# Patient Record
Sex: Male | Born: 1985 | Race: White | Hispanic: No | Marital: Single | Smoking: Never smoker
Health system: Southern US, Community
[De-identification: ages and names within clinical notes are randomized; demographics above are authoritative.]

## PROBLEM LIST (undated history)

## (undated) DIAGNOSIS — K5090 Crohn's disease, unspecified, without complications: Secondary | ICD-10-CM

## (undated) DIAGNOSIS — K604 Rectal fistula, unspecified: Secondary | ICD-10-CM

## (undated) DIAGNOSIS — K6289 Other specified diseases of anus and rectum: Secondary | ICD-10-CM

## (undated) DIAGNOSIS — K611 Rectal abscess: Secondary | ICD-10-CM

## (undated) HISTORY — PX: TREATMENT FISTULA ANAL: SUR1390

## (undated) HISTORY — DX: Crohn's disease, unspecified, without complications: K50.90

## (undated) HISTORY — DX: Rectal abscess: K61.1

## (undated) HISTORY — PX: OTHER SURGICAL HISTORY: SHX169

## (undated) HISTORY — DX: Other specified diseases of anus and rectum: K62.89

## (undated) HISTORY — DX: Rectal fistula: K60.4

## (undated) HISTORY — DX: Rectal fistula, unspecified: K60.40

---

## 2007-10-26 ENCOUNTER — Ambulatory Visit: Payer: Self-pay | Admitting: Internal Medicine

## 2007-10-26 ENCOUNTER — Encounter (INDEPENDENT_AMBULATORY_CARE_PROVIDER_SITE_OTHER): Payer: Self-pay | Admitting: *Deleted

## 2007-10-26 ENCOUNTER — Observation Stay (HOSPITAL_COMMUNITY): Admission: EM | Admit: 2007-10-26 | Discharge: 2007-10-27 | Payer: Self-pay | Admitting: Emergency Medicine

## 2007-11-21 DIAGNOSIS — K625 Hemorrhage of anus and rectum: Secondary | ICD-10-CM

## 2007-12-03 ENCOUNTER — Emergency Department (HOSPITAL_COMMUNITY): Admission: EM | Admit: 2007-12-03 | Discharge: 2007-12-03 | Payer: Self-pay | Admitting: Emergency Medicine

## 2008-03-10 ENCOUNTER — Emergency Department (HOSPITAL_COMMUNITY): Admission: EM | Admit: 2008-03-10 | Discharge: 2008-03-10 | Payer: Self-pay | Admitting: Emergency Medicine

## 2008-03-10 ENCOUNTER — Encounter (INDEPENDENT_AMBULATORY_CARE_PROVIDER_SITE_OTHER): Payer: Self-pay | Admitting: *Deleted

## 2008-04-14 ENCOUNTER — Emergency Department (HOSPITAL_COMMUNITY): Admission: EM | Admit: 2008-04-14 | Discharge: 2008-04-14 | Payer: Self-pay | Admitting: Emergency Medicine

## 2008-10-23 ENCOUNTER — Emergency Department (HOSPITAL_COMMUNITY): Admission: EM | Admit: 2008-10-23 | Discharge: 2008-10-23 | Payer: Self-pay | Admitting: Emergency Medicine

## 2008-11-20 ENCOUNTER — Encounter: Payer: Self-pay | Admitting: Internal Medicine

## 2008-12-02 ENCOUNTER — Telehealth: Payer: Self-pay | Admitting: Internal Medicine

## 2009-01-27 ENCOUNTER — Telehealth: Payer: Self-pay | Admitting: Internal Medicine

## 2009-01-30 ENCOUNTER — Ambulatory Visit: Payer: Self-pay | Admitting: Internal Medicine

## 2009-01-30 ENCOUNTER — Inpatient Hospital Stay (HOSPITAL_COMMUNITY): Admission: EM | Admit: 2009-01-30 | Discharge: 2009-02-04 | Payer: Self-pay | Admitting: Internal Medicine

## 2009-01-30 DIAGNOSIS — K5080 Crohn's disease of both small and large intestine without complications: Secondary | ICD-10-CM

## 2009-01-31 ENCOUNTER — Encounter (INDEPENDENT_AMBULATORY_CARE_PROVIDER_SITE_OTHER): Payer: Self-pay | Admitting: *Deleted

## 2009-01-31 ENCOUNTER — Encounter: Payer: Self-pay | Admitting: Internal Medicine

## 2009-02-19 ENCOUNTER — Ambulatory Visit: Payer: Self-pay | Admitting: Internal Medicine

## 2009-02-24 ENCOUNTER — Encounter: Payer: Self-pay | Admitting: Nurse Practitioner

## 2009-02-24 LAB — CONVERTED CEMR LAB
ALT: 25 units/L (ref 0–53)
AST: 26 units/L (ref 0–37)
Albumin: 3.8 g/dL (ref 3.5–5.2)
BUN: 10 mg/dL (ref 6–23)
Basophils Absolute: 0.1 10*3/uL (ref 0.0–0.1)
Basophils Relative: 1.1 % (ref 0.0–3.0)
Calcium: 9.2 mg/dL (ref 8.4–10.5)
Chloride: 102 meq/L (ref 96–112)
Eosinophils Absolute: 0.8 10*3/uL — ABNORMAL HIGH (ref 0.0–0.7)
Lymphocytes Relative: 24.1 % (ref 12.0–46.0)
MCHC: 33.3 g/dL (ref 30.0–36.0)
Neutrophils Relative %: 55.1 % (ref 43.0–77.0)
Potassium: 4.1 meq/L (ref 3.5–5.1)
RBC: 4.14 M/uL — ABNORMAL LOW (ref 4.22–5.81)

## 2009-03-02 ENCOUNTER — Ambulatory Visit: Payer: Self-pay | Admitting: Nurse Practitioner

## 2009-03-02 LAB — CONVERTED CEMR LAB
Basophils Absolute: 0.1 10*3/uL (ref 0.0–0.1)
Eosinophils Absolute: 0.6 10*3/uL (ref 0.0–0.7)
Lymphocytes Relative: 19.2 % (ref 12.0–46.0)
MCHC: 34 g/dL (ref 30.0–36.0)
Neutrophils Relative %: 65.4 % (ref 43.0–77.0)
Platelets: 310 10*3/uL (ref 150.0–400.0)
RDW: 13.8 % (ref 11.5–14.6)

## 2009-03-30 ENCOUNTER — Ambulatory Visit: Payer: Self-pay | Admitting: Internal Medicine

## 2009-07-15 ENCOUNTER — Encounter: Payer: Self-pay | Admitting: Internal Medicine

## 2010-03-16 NOTE — Assessment & Plan Note (Signed)
Summary: hosp f.u.Marland KitchenMarland KitchenMarland Kitchenem   History of Present Illness Visit Type: follow up  Primary GI MD: Lina Sar MD Primary Provider: n/a Requesting Provider: n/a Chief Complaint: Hosp f/u for Perirectal Abscess.  History of Present Illness:   [name] was hospitalized 01/30/09 to 02/04/09 with a  recurrent peri-rectal abscess secondary to Crohn's. 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 He was subsequently started on Azathioprine. Overall he is feeling much better. Having 1-3 soft BMs a day. Has some bleeding but unsure if coming from fistula or rectum. No fevers.  His rectal pain is better on Hydrocodone. Seen by Dr. Carolynne Edouard last week. Plan is for referral to colorectal surgeon.    GI Review of Systems      Denies abdominal pain, acid reflux, belching, bloating, chest pain, dysphagia with liquids, dysphagia with solids, heartburn, loss of appetite, nausea, vomiting, vomiting blood, weight loss, and  weight gain.      Reports rectal pain.     Denies anal fissure, black tarry stools, change in bowel habit, constipation, diarrhea, diverticulosis, fecal incontinence, heme positive stool, hemorrhoids, irritable bowel syndrome, jaundice, light color stool, liver problems, and  rectal bleeding.    Current Medications (verified): 1)  Azathioprine Sodium 100 Mg Solr (Azathioprine Sodium) .... One Tablet By Mouth Two Times A Day 2)  Hydrocodone-Acetaminophen 5-325 Mg Tabs (Hydrocodone-Acetaminophen) .... One Tablet By Mouth Every Four Hours As Needed For Pain  Allergies (verified): 1)  ! Pcn  Past History:  Past Medical History: Reviewed history from 01/30/2009 and no changes required. Hx of pelvic abcess Crohns Disease 2009  Past Surgical History: dental surgery I &D peri-rectal abscess  Family History: Family History of Breast Cancer: grandmother No FH of Colon Cancer:  Social History: Reviewed  history from 01/30/2009 and no changes required. Occupation: Waiter  [employer]  Alcohol Use - yes-2-3 beers once or twice per week Patient has never smoked.  Daily Caffeine Use Illicit Drug Use - no  Review of Systems  The patient denies allergy/sinus, anemia, anxiety-new, arthritis/joint pain, back pain, blood in urine, breast changes/lumps, change in vision, confusion, cough, coughing up blood, depression-new, fainting, fatigue, fever, headaches-new, hearing problems, heart murmur, heart rhythm changes, itching, muscle pains/cramps, night sweats, nosebleeds, shortness of breath, skin rash, sleeping problems, sore throat, swelling of feet/legs, swollen lymph glands, thirst - excessive, urination - excessive, urination changes/pain, urine leakage, vision changes, and voice change.    Vital Signs:  Patient profile:   25 year old male Height:      76 inches Weight:      165 pounds BMI:     20.16 BSA:     2.04 Pulse rate:   80 / minute Pulse rhythm:   regular BP sitting:   98 / 68  (left arm) Cuff size:   regular  Vitals Entered By: Ok Anis CMA (February 19, 2009 8:47 AM)  Physical Exam  General:  Well developed, well nourished, no acute distress. Neck:  no obvious masses  Lungs:  Clear throughout to auscultation. Heart:  Regular rate and rhythm; no murmurs, rubs,  or bruits. Abdomen:  Abdomen soft, nontender, nondistended. No obvious masses or hepatomegaly.Normal bowel sounds.  Rectal:  Setons intact. Yellow, blood-tinged drainage from healing right and left perianal wounds Msk:  Symmetrical with no gross deformities. Normal posture. Extremities:  No edema. Neurologic:  Alert and  oriented x4;  grossly normal neurologically. Skin:  Intact  without significant lesions or rashes. Cervical Nodes:  No significant cervical adenopathy. Psych:  Alert and cooperative. Normal mood and affect.   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 Until recently patient  had not been on any treatment for his disease secondary to finances. On 01/30/09 he was admitted to the hospital where he underwent I&D / placement of two Setons. He subsequently started Azathioprine and seems to be feeing better. Had follow up with surgery last week, plan is for referral to colorectal surgeon. Will contact [facility] for results of TPMT enzyme. For now continue current dose of Azathiopurine. Check labs now and CBC every two weeks until next follow up.    Orders: TLB-CBC Platelet - w/Differential (85025-CBCD) TLB-CMP (Comprehensive Metabolic Pnl) (80053-COMP)  Patient Instructions: 1)  Go to the lab, basement level. 2)  Follow up with Dr. Juanda Chance in 4 -6 weeks.   3)  We made you an appointment with Dr. Juanda Chance for 03-30-09 at 10:15AM.

## 2010-03-16 NOTE — Letter (Signed)
Summary: [facility]   Imported By: Lester Navesink 08/21/2009 10:40:41  _____________________________________________________________________  External Attachment:    Type:   Image     Comment:   External Document

## 2010-03-16 NOTE — Miscellaneous (Signed)
Summary: TPMT [facility]  Clinical Lists Changes  Orders: Added new Test order of Thiopurine Metabolites ([facility] #3200) [phone]) - Signed  Appended Document: TPMT [facility] Pt informed of the cost of this [facility] test.  Pt said he would come to our lab this week.

## 2010-05-17 LAB — URINALYSIS, ROUTINE W REFLEX MICROSCOPIC
Ketones, ur: NEGATIVE mg/dL
Nitrite: NEGATIVE
Protein, ur: NEGATIVE mg/dL
Urobilinogen, UA: 0.2 mg/dL (ref 0.0–1.0)
pH: 6 (ref 5.0–8.0)

## 2010-05-17 LAB — COMPREHENSIVE METABOLIC PANEL
AST: 20 U/L (ref 0–37)
BUN: 12 mg/dL (ref 6–23)
CO2: 27 mEq/L (ref 19–32)
Calcium: 8.8 mg/dL (ref 8.4–10.5)
Creatinine, Ser: 0.93 mg/dL (ref 0.4–1.5)
GFR calc non Af Amer: >60 mL/min (ref >60)

## 2010-05-17 LAB — CULTURE, ROUTINE-ABSCESS

## 2010-05-17 LAB — CBC
HCT: 36.3 % — ABNORMAL LOW (ref 39.0–52.0)
MCV: 87.7 fL (ref 78.0–100.0)
Platelets: 300 10*3/uL (ref 150–400)
RDW: 14.6 % (ref 11.5–15.5)

## 2010-05-17 LAB — SEDIMENTATION RATE: Sed Rate: 15 mm/hr (ref 0–16)

## 2010-05-17 LAB — C-REACTIVE PROTEIN: CRP: 1.4 mg/dL — ABNORMAL HIGH (ref <0.6)

## 2010-05-31 LAB — URINALYSIS, ROUTINE W REFLEX MICROSCOPIC
Nitrite: NEGATIVE
Specific Gravity, Urine: 1.012 (ref 1.005–1.030)
Urobilinogen, UA: 0.2 mg/dL (ref 0.0–1.0)
pH: 5.5 (ref 5.0–8.0)

## 2010-05-31 LAB — TYPE AND SCREEN: Antibody Screen: NEGATIVE

## 2010-05-31 LAB — CBC
Hemoglobin: 14.7 g/dL (ref 13.0–17.0)
Platelets: 381 10*3/uL (ref 150–400)
RDW: 14.2 % (ref 11.5–15.5)

## 2010-05-31 LAB — DIFFERENTIAL
Basophils Absolute: 0.1 10*3/uL (ref 0.0–0.1)
Basophils Relative: 1 % (ref 0–1)
Lymphocytes Relative: 12 % (ref 12–46)
Neutro Abs: 9.5 10*3/uL — ABNORMAL HIGH (ref 1.7–7.7)
Neutrophils Relative %: 76 % (ref 43–77)

## 2010-06-29 NOTE — Discharge Summary (Signed)
NAMEADOLPH, [name]                 ACCOUNT NO.:  [id number]   MEDICAL RECORD NO.:  [id number]          PATIENT TYPE:  INP   LOCATION:  5158                         FACILITY:  [facility]   PHYSICIAN:  Hedwig Morton. Juanda Chance, MD     DATE OF BIRTH:  [birth year]   DATE OF ADMISSION:  10/25/2007  DATE OF DISCHARGE:  10/27/2007                               DISCHARGE SUMMARY   ADMITTING DIAGNOSES:  1. Abdominal pain with of nausea and vomiting and long history of      intermittent rectal bleeding.  CT scan was suggestive of distal      small bowel inflammatory bowel disease and also suggestive of      intestinal abscess.  2. History of mononucleosis in his late teenage years.  3. History of placental surgery.  4. Status post multiple tattoos, starting at age 25.   DISCHARGE DIAGNOSES:  1. Crohn disease of the terminal ileum.  2. Fluid collection in what appears to be small bowel, filled with      contrast, not an abscess.  Radiology did not feel drainage      placement was indicated.  3. Elevated blood glucose, serum measurements ranging 127-168.  Does      not carry a prior diagnosis of the diabetes.  Rule out diabetes      mellitus type 2 versus glucose intolerance.   CONSULTATIONS:  None.   BRIEF HISTORY:  Mr. [name] is a 25 year old white male.  For several  months, he has had intermittent rectal bleeding and intermittent  abdominal pain that can last hours to a few days.  Generally, no nausea  or vomiting with this. but the day prior to this presentation, the  patient developed intense abdominal pain along with nausea and vomiting.  In the past, it was suggested that he see a gastroenterologist, but this  never occurred because he traveled to [place].  The patient also  describes several weeks of fatigue and weight fluctuation within a 10-15  pounds range and currently he was at the low end of that range.  He had  loose stools for several months.   The patient came to the emergency room at  [facility] for evaluation  because of the acute severity of his GI symptoms.  A CT scan performed  on October 26, 2007, showed marked inflammatory changes at the  terminal ileum suggestive of Crohn disease.  There was also a prominence  of soft tissue within the ileocolic region, possibly a conglomerate of  lymph nodes.  A pelvic abscess was also called by CT and a gas locule in  the lumen of the distal ileum near the abscess was noted possibly  representing a focal perforation.   Dr. Lina Sar was covering the unassigned GI call for that day and  admitted the patient.  The ER doctor had briefly run the patient  scenario by Dr. Alvira Philips who suggested that the ER call the GI service for  care of this patient.   LABORATORY DATA:  Initial white blood cell count 16.68, it corrected to  10.2.  Hemoglobin 14.1, hematocrit 41.2.  MCV 84.1.  Platelets 372.  Differential on the blood work showed a definite left shift.  The  erythrocyte sedimentation rate was 14 that is in the normal range.  Sodium 135, potassium 4.1.  Chloride 97, CO2 30.  Serum glucose ranged  127-168.  BUN 7, creatinine 1.1.  Total bilirubin 1.2, alkaline  phosphatase 53, AST 12, ALT 21.  Albumin 3.4.  Lipase 12.  Urinalysis  was negative.   IMAGING STUDIES:  Initial CT scan with contrast:  1. Marked inflammatory changes involving terminal ileum suggestive of      inflammatory bowel disease.  There is associated lymph adenopathy      and prominent soft tissue mass within the ileocolic mesentery      favoring conglomeration lymph nodes.  2. Pelvic abscess.  3. A locule of gas, slightly centric to the lumen of distal ileum near      the level of the abscess for which focal perforation is not      excluded.   Limited CT scan used during this study to assess for possible drain  placement showed the fluid collection in the pelvis is suspected to  represent redundant sigmoid colon and this area is filled with oral   contrast.  No definite abscess present.  The abnormal bowel with marked  wall thickening is we demonstrated extending from the pelvis to the  right lower quadrant.  No drain was placed.   HOSPITAL COURSE:  The patient was admitted to [facility] to non-  telemetry bed.  He was started on IV ciprofloxacin and Flagyl along with  available analgesics of p.o. Vicodin and IV Dilaudid as well as  antiemetics IV Phenergan p.r.n.  The patient was started on oral  Pentasa.  Stool studies for C. diff, stool culture, and O and P were  ordered, but staff was unable to collect specimens.   The patient went down to Interventional Radiology for abscess drain.  However, on repeat CT study, they determined that what looked like  abscess/fluid collection was actually redundant contrast-filled small  bowel and no drainage was indicated.  The patient was clinically  improving.  He had not had any further nausea, vomiting, fever, or  chills.  His white blood cell count had normalized.  On October 27, 2007, he was discharged home.  He had an appointment set up to see Dr.  Juanda Chance on November 27, 2007, at 8:45 a.m.  He also was given the phone  number for [facility] to contact for setting up appointment to  establish a primary care [facility] [place] and access to low-cost medications and  lower cost medical care.  The patient unfortunately does not have  medical insurance.   DISCHARGE MEDICATIONS:  1. Prednisone 40 mg, that is four 10-mg tablets, once daily for 2      weeks, then 30 mg daily.  2. Flagyl/metronidazole 250 mg 1 p.o. t.i.d. for 10 days with a      warning not to use alcohol while taking this medication.  3. Vicodin 5/500 mg 1 tablet every 6 hours as needed for severe pain.  4. He did not get a prescription for Pentasa.   DISCHARGE DIET:  Low fiber.   DISCHARGE CONDITION:  Stable and improved.      Jennye Moccasin, PA-C      Hedwig Morton. Juanda Chance, MD  Electronically Signed    SG/MEDQ  D:   02/27/2008  T:  02/27/2008  Job:  [id number]  cc:   Hedwig Morton. Juanda Chance, MD  Bonnye Fava

## 2010-06-29 NOTE — H&P (Signed)
[name], [name]                 ACCOUNT NO.:  [id number]   MEDICAL RECORD NO.:  [id number]          Wells TYPE:  INP   LOCATION:  5158                         FACILITY:  [facility]   PHYSICIAN:  Hedwig Morton. Juanda Chance, MD     DATE OF BIRTH:  [birth year]   DATE OF ADMISSION:  10/26/2007  DATE OF DISCHARGE:                              HISTORY & PHYSICAL   CHIEF COMPLAINT:  Abdominal pain, nausea, and vomiting.   HISTORY OF PRESENT ILLNESS:  [name] is a 25 year old white male.  He  has at least a year's history of intermittent rectal bleeding and then  several months history of intermittent pain in Kent stomach that lasts  several hours up to a few days.  It has not historically been associated  with nausea or vomiting, but all that changed yesterday when Kent pain  was intense and he started throwing up.  He says that several months  back when he was living in [place] a primary care physician wanted him  to be seen at a gastroenterologist's office, but [name] went to  [place] and never did see a GI specialist.  He has now returned to  [place], is working here, although he is without medical health  insurance.  For Kent last 2 months, he has noticed a decline in energy.  Weight has fluctuated within a 10-15 pounds range and he says he is at  Kent low end of Kent range right now.  Stools have been loose for several  months.  [name] presented to Kent emergency room because of Kent  intensity of symptoms with Kent additional new problem of nausea and  vomiting and underwent initial labs and CT imaging per Kent ER staff.   [name] was given 2 mg of Dilaudid, Zofran, and IV fluids in Kent  emergency room and his symptoms were much improved, although now, a few  hours later he is having some recurrent abdominal pain and requesting  analgesia.   [name] has not been using any medication at home to relieve his  symptoms.  No antidiarrheals no analgesics.  Food traditionally has not  necessarily impacted Kent level of pain, but sometimes certain foods seem  to have triggered more pain when he is having a particularly bad flare.  Kent amount of blood that he sees is sometimes Kent blood in Kent commode  water surrounding Kent stool, sometimes he sees it when he wipes and  rarely has he seen it just pure blood and no stool.   Labs were remarkable for a white blood cell count of 16.6.  CT scan is  remarkable for what looks like Crohn disease in Kent terminal ileum with  abscesses and possibly focal perforation in Kent region of Kent terminal  ileum.  Temperature on arrival was 101.1 and his pulse was 135.  Dr.  Baruch Merl was contacted, but did not see [name].  She suggested  calling Dr. Juanda Chance, who is covering for GI unassigned.  We are now  evaluating [name] and plan to admit him to [facility] hospital for  further  studies and symptomatic care.   PAST MEDICAL HISTORY:  [name] had mononucleosis in his late teen  years.  He underwent dental surgery within Kent past few years.  He does  have multiple tattoos on Kent right arm and Kent first of these started at  age 36.  He had some of Kent usual childhood illnesses and is up-to-date  on immunizations as far as he knows.   MEDICATIONS:  Not taking any medications, either prescribed or over-Kent-  counter.   ALLERGIES:  His mother has informed him that he has a penicillin allergy  but he does know what Kent reaction was.   FAMILY HISTORY:  Negative for Crohn disease, ulcerative colitis, or  cancers.  His mother lives in [place] currently, her phone number  right now is [phone].   SOCIAL HISTORY:  [name] lives in [place].  He is single.  He is  working as a Airline pilot at [employer].  He has been  studying business in marketing, right now not in school but plans to  reenter [employer] this spring semester.  Alcohol intake currently consists of  about 2-3 beers once or twice a week.  In Kent  past, he was able to drink  up to a 6-pack though not often he drinks that much and he tended to  drink beer on a daily basis.  [name] is currently without medical  health insurance.   REVIEW OF SYSTEMS:  CONSTITUTIONAL:  [name] has decreased energy.  [name] notes that he used to run 4 miles several times a week, but  in Kent last couple months he has not had Kent energy to do this.  VISUAL:  He is not having any visual problems.  ENT:  No sore throat.  No sores  in his mouth.  CARDIOVASCULAR:  No palpitations.  No chest pain.  PULMONARY:  No shortness of breath.  No cough.  GU:  No dysuria.  No  increased frequency.  MUSCULOSKELETAL:  Does have some pain in his  sacral iliac sometimes when he has been on his feet a long time, but it  is not severe pain and it certainly is not awakening him at night.  DERMATOLOGIC:  He does have tattoos on Kent upper right arm.  He started  on Kent tattoo that he has there at age 4.  He says that he has always  gone to reputable tattoo parlors who he thinks are following good  hygiene.  NEUROLOGIC:  No paresthesias, no syncope, and no dizziness.  PSYCHIATRIC:  No mood swings.  ENDOCRINE:  No excessive thirst.  No  history of problems with his blood sugars.  HEMATOLOGIC:  Other than Kent  GI bleeding, he has not had any unusual bleeding.  No swelling of lymph  nodes.  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  He has also been involved as  of a paid participant in medical research and that they also tested him  for various viruses and he has always been negative.   PHYSICAL EXAMINATION:  GENERAL:  [name] is a tall, thin, somewhat  pale young white male who is in no distress.  He provides an excellent  history.  VITAL SIGNS:  Temperature maximum of 101.1, blood pressure 125/68, pulse  initially was  135,  but a couple of hours later was rechecked and was 86.  His respirations are 16.  His room air saturation is 98%.  HEENT:  Sclerae are nonicteric.  Conjunctivae are pink.  Extraocular  movements intact.  Oropharynx is moist and clear.  Dentition in good  repair.  NECK:  No masses, no JVD, no cough, and no dyspnea.  CARDIOVASCULAR:  Regular rate and rhythm.  No murmurs, rubs, or gallops.  ABDOMEN:  Soft with hypoactive bowel sounds.  There is no masses, no  hepatosplenomegaly.  There is some tenderness without guarding or  rebound and Kent tenderness is more notable in Kent left lower quadrant  and less so in Kent right lower quadrant.  RECTAL:  Kent rectal vault is empty.  There are no internal masses.  There is a small external hemorrhoidal tag palpable.  EXTREMITIES:  There is no cyanosis, clubbing, or edema.  DERMATOLOGIC:  There is an extensive series of tattoos on Kent right  upper arm up into Kent right shoulder.  NEUROLOGIC:  [name] is alert and oriented x3.  There is no tremor.  He moves all 4 limbs easily.  He is able to walk without assistance.  PSYCHIATRIC:  [name] is pleasant, cooperative, and seems  intelligent.   IMPRESSION:  Long history of intermittent rectal bleeding and several  months history of intermittent bouts of abdominal pain, now with  particularly bad flare of pain along with nausea and vomiting.  CT scan  highly suggestive of Crohn disease involving Kent distal small bowel and  also Kent CT is revealing what looks like abscesses.  [name] is  febrile and has a leukocytosis.   PLAN:  [name] is being admitted to Dr. Regino Schultze service for  hydration.  We will allow full liquids for Kent time being.  Provide  Vicodin, Dilaudid, and Phenergan as needed.  In Kent emergency room, Kent  Wells received antibiotics of clindamycin and gentamicin.  We will  continue inpatient antibiotics of Cipro and Flagyl.  Laboratory wise, we  will plan to check a sed rate  and followup a CBC in a couple of days.      Jennye Moccasin, PA-C      Hedwig Morton. Juanda Chance, MD  Electronically Signed    SG/MEDQ  D:  10/26/2007  T:  10/26/2007  Job:  [id number]

## 2010-06-29 NOTE — H&P (Signed)
[name], [name]                 ACCOUNT NO.:  [id number]   MEDICAL RECORD NO.:  [id number]          PATIENT TYPE:  INP   LOCATION:  5158                         FACILITY:  [facility]   PHYSICIAN:  Hedwig Morton. Juanda Chance, MD     DATE OF BIRTH:  01-23-1986   DATE OF ADMISSION:  10/26/2007  DATE OF DISCHARGE:                              HISTORY & PHYSICAL   ADDENDUM   LABORATORY DATA:  White blood cell count 16.6, hemoglobin 14.8,  hematocrit 44.2, and platelets 348,000.  MCV 85.  Sodium 135 and  potassium 4.1.  Chloride 97 and CO2 of 30.  BUN 7 and creatinine 1.1.  Glucose 127.  Albumin 3.4, total bilirubin 1.2, alkaline phosphatase 12,  and lipase 21.  AST 53 and ALT 21.  CT scan of the abdomen and pelvis,  abdominal view shows no abnormalities.  In the pelvis, there are marked  inflammatory changes at the terminal ileum, suggestive of inflammatory  bowel disease, specifically Crohn disease.  Less likely consideration is  infectious ileitis.  There is associated lymphadenopathy.  Prominent  soft tissue mass within the ileocolic mesentery favors a conglomeration  of lymph nodes, but attention to followup is necessary.  There is  approximately 7 x 2 x 5.7 x 6.8 cm pelvic abscess.  Locule of gas at the  distal lumen near the abscess, which could represent focal perforation.      Jennye Moccasin, PA-C      Hedwig Morton. Juanda Chance, MD  Electronically Signed    SG/MEDQ  D:  10/26/2007  T:  10/26/2007  Job:  [id number]

## 2010-06-29 NOTE — Consult Note (Signed)
NAME:  BLASE, BECKNER NO.:  [id number]   MEDICAL RECORD NO.:  [id number]          PATIENT TYPE:  EMS   LOCATION:  MAJO                         FACILITY:  [facility]   PHYSICIAN:  Alfonse Ras, MD   DATE OF BIRTH:  1985-11-27   DATE OF CONSULTATION:  DATE OF DISCHARGE:                                 CONSULTATION   REQUESTING PHYSICIAN:  Chriss Driver, MD in the ER.   CHIEF COMPLAINT:  Left perirectal abscess.   HISTORY OF PRESENT ILLNESS:  Mr. Halleck is a 25 year old male patient with  known Crohn disease involving the terminal ileum, diagnosed in September  2009.  He was last hospitalized on February 27, 2008, with a Crohn  exacerbation.  At that time, he was treated with steroids and Flagyl and  sent out on tapering steroids, which he has subsequently completed.  He  has not followed up with Dr. Juanda Chance, his GI doctor, yet because of job  responsibilities and extensive travel.  He has noted 2 weeks of  discomfort involving the left buttock, 1 week of a noticeable boil  involving the left buttock and perirectal region, increasing pain over  the past week, especially with movement.  He has also noticed in the  past week a resumption of his bloody mucus diarrhea, similar to Crohn  exacerbation.  He has not noticed any purulence in this fluid or any  unusual odors.  He presented to the ER because of pain, and his white  count was elevated at 12,600.  He underwent a CT of the abdomen and  pelvis, which showed actual decrease in inflammatory change of the  terminal ileum as compared to previous scan, but he does have a left  perirectal abscess that extends anteriorly and inferiorly, reaching the  base of the penis with inflammation of the medial aspect of the upper  left thigh.  This area measures 6.2 x 1.6 x 4.7 cm.  Dr. Colin Benton has  reviewed the CTs with me prior to coming to the ER and recommends  bedside I&D of the abscess.   REVIEW OF SYSTEMS:  The patient has reported  no fevers or chills at  home.  At home, he has had pain with voiding today and was moving around  today, he felt a cold chill.  He has a chronic mild abdominal pain  related to his Crohn disease although his symptoms have greatly improved  since last September and have not worsened since the last  hospitalization.   SOCIAL HISTORY:  No tobacco.  Social alcohol only.  He travels  extensively with his job.  He works as a Production manager of gold, silver, and  antiques.   FAMILY AND MEDICAL HISTORY:  Noncontributory.   PAST MEDICAL HISTORY:  1. Crohn's of the terminal ileum, diagnosed in September in 2009.  2. Hemorrhoids.   PAST SURGICAL HISTORY:  Denies.   ALLERGIES:  PENICILLIN.   CURRENT MEDICATIONS:  None.   PHYSICAL EXAMINATION:  GENERAL:  A pleasant male patient complaining of  the left buttock pain and anxiety over impending I&AND procedure.  VITAL SIGNS:  Temperature 97.7, BP 105/60, pulse 62 and regular,  respirations 18.  PSYCH:  The patient is alert and oriented x3.  His affect is appropriate  to current situation.  NEURO:  Cranial nerves II-XII grossly intact.  He is moving all  extremities x4 without focal deficits.  CHEST:  Bilateral lung sounds clear to auscultation.  Respiratory effort  is nonlabored.  No JVD.  No peripheral edema.  Pulses nontachycardic.  ABDOMEN:  Soft and mildly tender without guarding or rebounding,  nondistended.  Bowel sounds are present.  GENITOURINARY:  External genitalia are normal in appearance.  Scrotum  has both testes down and palpable.  He does have some erythema extending  towards the base of the penis without any fluctuance or induration felt  there.  SKIN:  There is a visible indurated area involving left buttock,  extending towards the rectal area.  The rectum itself is tender  externally.  DRE was not performed.  This area is nonfluctuant but is  indurated and quite tender.  This area measures externally about 5-6 cm  at its length  anterior to posterior with a lateral width of about 2-2.5  cm.  There is no purulence noted.  EXTREMITIES:  Symmetrical in appearance without cyanosis or clubbing.   LABORATORY DATA:  Urinalysis is negative.  White count is 12,600,  hemoglobin 14.7.  CT of the abdomen and pelvis is noted with a fluid  collection involving the perirectal, extending down towards the thigh,  and nearly up towards the base of the penis.   IMPRESSION:  Perirectal abscess.   PLAN:  1. Per Dr. Tami Lin recommendation, bedside I&D.  2. Outpatient antibiotics to cover MRSA, as well as to cover any      potential Chron's and enteric pathogens as well.  If cultures were      able to be obtained, we will follow up on those, otherwise, plan on      using doxycycline and Flagyl.  3. Follow up with [facility] in 3 days.      Allison L. Rolene Course      Alfonse Ras, MD  Electronically Signed    ALE/MEDQ  D:  03/10/2008  T:  03/10/2008  Job:  [id number]   cc:   Hedwig Morton. Juanda Chance, MD

## 2010-06-29 NOTE — Op Note (Signed)
NAME:  [name], BAUMLER NO.:  [id number]   MEDICAL RECORD NO.:  [id number]          PATIENT TYPE:  EMS   LOCATION:  MAJO                         FACILITY:  [facility]   PHYSICIAN:  Alfonse Ras, MD   DATE OF BIRTH:  13-Oct-1985   DATE OF PROCEDURE:  DATE OF DISCHARGE:                               OPERATIVE REPORT   PROCEDURE:  Incision and drainage of perirectal abscess.   ANESTHESIA:  Lidocaine 1% infiltrated directly into the skin above into  and surrounding the left perirectal abscess.  In addition, the nurse  gave the patient 1 mg of Dilaudid IV.   PROCEDURE:  Once superficial and deep anesthesia has been achieved with  infiltration of lidocaine using a #11 scalpel, a 1.5-cm elliptical  incision was made with immediate returns of fresh red blood.  No  purulence was noted.  This incision was made directly over the area of  induration adjacent to the rectum and the buttock region.  This extends  out about 1.5 to 2 cm away from the rectum.  This area was explored with  hemostat deeply without any purulence return.  Additional anesthetic  agent was infiltrated more distally below the initial incision  superficially and deeply towards the base of the penis to allow deeper  exploration of the wound.  Again the forceps were introduced maybe a  small amount of purulence was noted but I was unable to reach a definite  pocket of purulence as noted on CT. The wound again had mainly blood  returns.  The wound was packed with quarter inch Iodoform packing and a  dry dressing applied to achieve hemostasis.      Allison L. Rolene Course      Alfonse Ras, MD  Electronically Signed    ALE/MEDQ  D:  03/10/2008  T:  03/11/2008  Job:  [phone] [phone]

## 2010-11-16 LAB — COMPREHENSIVE METABOLIC PANEL
Albumin: 3.8
Alkaline Phosphatase: 54
BUN: 9
CO2: 28
Chloride: 100
Creatinine, Ser: 1.1
GFR calc non Af Amer: >60
Potassium: 4.4
Total Bilirubin: 1.5 — ABNORMAL HIGH

## 2010-11-16 LAB — URINALYSIS, ROUTINE W REFLEX MICROSCOPIC
Hgb urine dipstick: NEGATIVE
Nitrite: NEGATIVE
Protein, ur: NEGATIVE
Specific Gravity, Urine: 1.028
Urobilinogen, UA: 0.2

## 2010-11-16 LAB — CBC
HCT: 42.3
Hemoglobin: 14.1
MCV: 85.1
Platelets: 398
RBC: 4.98
WBC: 13.3 — ABNORMAL HIGH

## 2010-11-16 LAB — DIFFERENTIAL
Basophils Absolute: 0.1
Basophils Relative: 1
Eosinophils Relative: 1
Lymphocytes Relative: 10 — ABNORMAL LOW
Monocytes Absolute: 0.8
Neutro Abs: 11 — ABNORMAL HIGH

## 2010-11-16 LAB — LIPASE, BLOOD: Lipase: 18

## 2010-11-17 LAB — URINALYSIS, ROUTINE W REFLEX MICROSCOPIC
Glucose, UA: NEGATIVE
Hgb urine dipstick: NEGATIVE
Ketones, ur: NEGATIVE
Protein, ur: NEGATIVE
Urobilinogen, UA: 0.2

## 2010-11-17 LAB — COMPREHENSIVE METABOLIC PANEL
Albumin: 3.4 — ABNORMAL LOW
BUN: 7
Calcium: 9
Creatinine, Ser: 1.16
GFR calc Af Amer: >60
Total Bilirubin: 1.2
Total Protein: 7.1

## 2010-11-17 LAB — CBC
HCT: 44.2
Hemoglobin: 14.1
MCHC: 33.5
MCHC: 34.1
MCV: 85.4
Platelets: 348
RBC: 4.9
RDW: 14.4

## 2010-11-17 LAB — DIFFERENTIAL
Basophils Absolute: 0
Lymphocytes Relative: 5 — ABNORMAL LOW
Lymphs Abs: 0.8
Monocytes Absolute: 0.7
Monocytes Relative: 4
Neutro Abs: 15.1 — ABNORMAL HIGH

## 2010-11-17 LAB — BASIC METABOLIC PANEL
CO2: 28
Calcium: 9.1
GFR calc Af Amer: >60
GFR calc non Af Amer: >60
Potassium: 5.3 — ABNORMAL HIGH
Sodium: 135

## 2012-02-29 ENCOUNTER — Encounter (INDEPENDENT_AMBULATORY_CARE_PROVIDER_SITE_OTHER): Payer: Self-pay | Admitting: General Surgery

## 2012-02-29 ENCOUNTER — Ambulatory Visit (INDEPENDENT_AMBULATORY_CARE_PROVIDER_SITE_OTHER): Payer: 59 | Admitting: General Surgery

## 2012-02-29 VITALS — BP 130/90 | HR 78 | Temp 97.8°F | Resp 18 | Ht 76.0 in | Wt 188.0 lb

## 2012-02-29 DIAGNOSIS — K612 Anorectal abscess: Secondary | ICD-10-CM

## 2012-02-29 DIAGNOSIS — K611 Rectal abscess: Secondary | ICD-10-CM | POA: Insufficient documentation

## 2012-02-29 DIAGNOSIS — K603 Anal fistula: Secondary | ICD-10-CM

## 2012-02-29 MED ORDER — CIPROFLOXACIN HCL 500 MG PO TABS: 500.0000 mg | ORAL_TABLET | Freq: Two times a day (BID) | ORAL | Status: AC

## 2012-02-29 MED ORDER — METRONIDAZOLE 500 MG PO TABS: 500.0000 mg | ORAL_TABLET | Freq: Three times a day (TID) | ORAL | Status: DC

## 2012-02-29 NOTE — Patient Instructions (Signed)
We drained a small abscess in your perianal area on the left side today.  I suspect that you have another fistula in ano.  Take a warm tub bath 3 times a day. Wear a pad. Expected drainage.  I have called in a prescription for Cipro and Flagyl to the [facility] on [address]. This is a 15 day supply.  You may return to see Dr. Derrell Lolling or Dr. Carolynne Edouard in 1 month.  Ewer strongly advised to return to see Dr. Charlena Cross, your colorectal surgeon in [place].

## 2012-02-29 NOTE — Progress Notes (Signed)
Patient ID: [name], male   DOB: January 12, 1986, 27 y.o.   MRN: [id number] History: This patient was referred to the urgent office clinic by Dr. Kateri Plummer at [facility] for a perirectal abscess. The patient states he had a perirectal abscess drained a few years ago. He states that he later had to have a fistula operation with placement of setons. 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 Charlena Cross, colorectal surgery in [facility].  Marland Kitchen The patient says states he saw Dr. Toni Arthurs once. Significant history is he was given a diagnosis of Crohn's disease by CT scan in 2009. He has never followed up with a gastroenterologist. He states that Dr. Kateri Plummer is actively referring him to a gastroenterlogist at this time.  Current problem is a one-week history of rectal pain and a little but of bloody drainage. He is not having diarrhea or fever or chills  Exam: A thin young man in no distress. Afebrile Abdomen soft flat nontender. No scars. No masses. No hernias Rectal he has a little bit of induration on the left side slightly left posterior. I found the external draining sinus and was able to pass a probe into this. After anesthetizing this with Xylocaine I opened it up to be sure there wasn't an abscess and there was very minimal pus.. Anoscopy was performed and I suspect that he may have an internal opening of the left side as well. There was no intramural abscess.  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 Called to [facility] [place].  Sitz baths 3 times daily Return to see [place] in one month I strongly urged him to see Dr. Charlena Cross in [place] in followup for further management of this complex fistula in  [facility], as previously advised. I strongly urged him to establish with a gastroenterologist. He states he will do all of this.   Angelia Mould. Derrell Lolling, M.D., [facility] General and Minimally invasive Surgery Breast and Colorectal Surgery Office:   [phone] Pager:   [phone]

## 2012-03-06 ENCOUNTER — Ambulatory Visit (INDEPENDENT_AMBULATORY_CARE_PROVIDER_SITE_OTHER): Payer: 59 | Admitting: Surgery

## 2012-03-09 ENCOUNTER — Other Ambulatory Visit: Payer: Self-pay | Admitting: Gastroenterology

## 2012-03-09 DIAGNOSIS — K611 Rectal abscess: Secondary | ICD-10-CM

## 2012-03-09 DIAGNOSIS — R109 Unspecified abdominal pain: Secondary | ICD-10-CM

## 2012-03-13 ENCOUNTER — Ambulatory Visit
Admission: RE | Admit: 2012-03-13 | Discharge: 2012-03-13 | Disposition: A | Discharge status: Home / Self Care | Payer: 59 | Source: Ambulatory Visit | Attending: Gastroenterology | Admitting: Gastroenterology

## 2012-03-13 DIAGNOSIS — R109 Unspecified abdominal pain: Secondary | ICD-10-CM

## 2012-03-13 DIAGNOSIS — K611 Rectal abscess: Secondary | ICD-10-CM

## 2012-03-13 MED ORDER — IOHEXOL 300 MG/ML  SOLN
100.0000 mL | Freq: Once | INTRAMUSCULAR | Status: AC | PRN
  Administered 2012-03-13: 100 mL via INTRAVENOUS

## 2012-04-02 ENCOUNTER — Encounter (INDEPENDENT_AMBULATORY_CARE_PROVIDER_SITE_OTHER): Payer: 59 | Admitting: General Surgery

## 2012-04-26 ENCOUNTER — Encounter (INDEPENDENT_AMBULATORY_CARE_PROVIDER_SITE_OTHER): Payer: 59 | Admitting: General Surgery

## 2014-02-13 ENCOUNTER — Inpatient Hospital Stay: Payer: No Typology Code available for payment source | Admitting: Anesthesiology

## 2014-02-13 ENCOUNTER — Emergency Department: Payer: No Typology Code available for payment source

## 2014-02-13 ENCOUNTER — Inpatient Hospital Stay
Admission: EM | Admit: 2014-02-13 | Discharge: 2014-02-15 | DRG: 355 | Disposition: A | Discharge status: Home / Self Care | Payer: No Typology Code available for payment source | Attending: Surgery | Admitting: Surgery

## 2014-02-13 ENCOUNTER — Inpatient Hospital Stay: Payer: No Typology Code available for payment source | Admitting: Surgery

## 2014-02-13 ENCOUNTER — Encounter
Admission: EM | Disposition: A | Discharge status: Home / Self Care | Payer: Self-pay | Source: Home / Self Care | Attending: Surgery

## 2014-02-13 DIAGNOSIS — K352 Acute appendicitis with generalized peritonitis: Secondary | ICD-10-CM

## 2014-02-13 DIAGNOSIS — K3532 Acute appendicitis with perforation and localized peritonitis, without abscess: Secondary | ICD-10-CM

## 2014-02-13 DIAGNOSIS — K429 Umbilical hernia without obstruction or gangrene: Secondary | ICD-10-CM | POA: Diagnosis present

## 2014-02-13 DIAGNOSIS — K5000 Crohn's disease of small intestine without complications: Principal | ICD-10-CM | POA: Diagnosis present

## 2014-02-13 HISTORY — PX: LAPAROSCOPY, DIAGNOSTIC: SHX4584

## 2014-02-13 HISTORY — PX: REPAIR, UMBILICAL HERNIA: SHX5330

## 2014-02-13 LAB — COMPREHENSIVE METABOLIC PANEL
ALT: 22 U/L (ref 0–55)
AST (SGOT): 16 U/L (ref 5–34)
Albumin/Globulin Ratio: 1.3 (ref 0.9–2.2)
Albumin: 4.2 g/dL (ref 3.5–5.0)
Alkaline Phosphatase: 54 U/L (ref 38–106)
Anion Gap: 8 (ref 5.0–15.0)
BUN: 12 mg/dL (ref 9.0–28.0)
Bilirubin, Total: 1.3 mg/dL — ABNORMAL HIGH (ref 0.2–1.2)
CO2: 28 mEq/L (ref 22–29)
Calcium: 9.5 mg/dL (ref 8.5–10.5)
Chloride: 102 mEq/L (ref 100–111)
Creatinine: 0.9 mg/dL (ref 0.7–1.3)
Globulin: 3.2 g/dL (ref 2.0–3.6)
Glucose: 107 mg/dL — ABNORMAL HIGH (ref 70–100)
Potassium: 3.9 mEq/L (ref 3.5–5.1)
Protein, Total: 7.4 g/dL (ref 6.0–8.3)
Sodium: 138 mEq/L (ref 136–145)

## 2014-02-13 LAB — CBC AND DIFFERENTIAL
Basophils Absolute Automated: 0.02 10*3/uL (ref 0.00–0.20)
Basophils Automated: 0 %
Eosinophils Absolute Automated: 0.13 10*3/uL (ref 0.00–0.70)
Eosinophils Automated: 1 %
Hematocrit: 41.1 % — ABNORMAL LOW (ref 42.0–52.0)
Hgb: 14.9 g/dL (ref 13.0–17.0)
Immature Granulocytes Absolute: 0.02 10*3/uL
Immature Granulocytes: 0 %
Lymphocytes Absolute Automated: 1.27 10*3/uL (ref 0.50–4.40)
Lymphocytes Automated: 10 %
MCH: 30.8 pg (ref 28.0–32.0)
MCHC: 36.3 g/dL — ABNORMAL HIGH (ref 32.0–36.0)
MCV: 84.9 fL (ref 80.0–100.0)
MPV: 9.8 fL (ref 9.4–12.3)
Monocytes Absolute Automated: 0.95 10*3/uL (ref 0.00–1.20)
Monocytes: 7 %
Neutrophils Absolute: 10.94 10*3/uL — ABNORMAL HIGH (ref 1.80–8.10)
Neutrophils: 82 %
Nucleated RBC: 0 /100 WBC (ref 0–1)
Platelets: 290 10*3/uL (ref 140–400)
RBC: 4.84 10*6/uL (ref 4.70–6.00)
RDW: 14 % (ref 12–15)
WBC: 13.31 10*3/uL — ABNORMAL HIGH (ref 3.50–10.80)

## 2014-02-13 LAB — URINALYSIS, REFLEX TO MICROSCOPIC EXAM IF INDICATED
Bilirubin, UA: NEGATIVE
Blood, UA: NEGATIVE
Glucose, UA: NEGATIVE
Ketones UA: NEGATIVE
Leukocyte Esterase, UA: NEGATIVE
Nitrite, UA: NEGATIVE
Protein, UR: 30 — AB
Specific Gravity UA: 1.035 (ref 1.001–1.035)
Urine pH: 5 (ref 5.0–8.0)
Urobilinogen, UA: NEGATIVE mg/dL

## 2014-02-13 LAB — GFR: EGFR: >60

## 2014-02-13 LAB — LIPASE: Lipase: 23 U/L (ref 8–78)

## 2014-02-13 LAB — HEMOLYSIS INDEX: Hemolysis Index: 11 (ref 0–18)

## 2014-02-13 SURGERY — LAPAROSCOPY, DIAGNOSTIC
Anesthesia: Anesthesia General | Site: Abdomen | Wound class: Clean

## 2014-02-13 MED ORDER — DEXAMETHASONE SOD PHOSPHATE PF 10 MG/ML IJ SOLN
INTRAMUSCULAR | Status: AC
Start: 2014-02-13 — End: ?
  Filled 2014-02-13: qty 1

## 2014-02-13 MED ORDER — HYDROMORPHONE HCL PF 1 MG/ML IJ SOLN
INTRAMUSCULAR | Status: DC | PRN
Start: 2014-02-13 — End: 2014-02-13
  Administered 2014-02-13: 0.5 mg via INTRAVENOUS
  Administered 2014-02-13: .5 mg via INTRAVENOUS

## 2014-02-13 MED ORDER — ONDANSETRON HCL 4 MG/2ML IJ SOLN
INTRAMUSCULAR | Status: AC
Start: 2014-02-13 — End: ?
  Filled 2014-02-13: qty 2

## 2014-02-13 MED ORDER — FENTANYL CITRATE 0.05 MG/ML IJ SOLN
INTRAMUSCULAR | Status: AC
Start: 2014-02-13 — End: ?
  Filled 2014-02-13: qty 2

## 2014-02-13 MED ORDER — ROCURONIUM BROMIDE 50 MG/5ML IV SOLN
INTRAVENOUS | Status: AC
Start: 2014-02-13 — End: ?
  Filled 2014-02-13: qty 5

## 2014-02-13 MED ORDER — DEXTROSE-NACL 5-0.45 % IV SOLN
100.00 mL/h | INTRAVENOUS | Status: AC
Start: 2014-02-14 — End: 2014-02-15
  Administered 2014-02-14: 100 mL/h via INTRAVENOUS

## 2014-02-13 MED ORDER — FENTANYL CITRATE 0.05 MG/ML IJ SOLN
INTRAMUSCULAR | Status: AC
Start: 2014-02-13 — End: 2014-02-13
  Administered 2014-02-13: 25 ug via INTRAVENOUS
  Filled 2014-02-13: qty 2

## 2014-02-13 MED ORDER — IOHEXOL 350 MG/ML IV SOLN
100.00 mL | Freq: Once | INTRAVENOUS | Status: AC | PRN
Start: 2014-02-13 — End: 2014-02-13
  Administered 2014-02-13: 100 mL via INTRAVENOUS

## 2014-02-13 MED ORDER — NALOXONE HCL 0.4 MG/ML IJ SOLN
0.2000 mg | INTRAMUSCULAR | Status: DC | PRN
Start: 2014-02-13 — End: 2014-02-15

## 2014-02-13 MED ORDER — GLYCOPYRROLATE 0.2 MG/ML IJ SOLN
INTRAMUSCULAR | Status: DC | PRN
Start: 2014-02-13 — End: 2014-02-13
  Administered 2014-02-13: .6 mg via INTRAVENOUS

## 2014-02-13 MED ORDER — GLYCOPYRROLATE 0.2 MG/ML IJ SOLN
INTRAMUSCULAR | Status: AC
Start: 2014-02-13 — End: ?
  Filled 2014-02-13: qty 3

## 2014-02-13 MED ORDER — ONDANSETRON 4 MG PO TBDP
4.0000 mg | ORAL_TABLET | Freq: Three times a day (TID) | ORAL | Status: DC | PRN
Start: 2014-02-13 — End: 2014-02-15

## 2014-02-13 MED ORDER — MORPHINE SULFATE 2 MG/ML IJ/IV SOLN (WRAP)
4.0000 mg | Status: DC | PRN
Start: 2014-02-13 — End: 2014-02-13

## 2014-02-13 MED ORDER — LIDOCAINE HCL 2 % IJ SOLN
INTRAMUSCULAR | Status: DC | PRN
Start: 2014-02-13 — End: 2014-02-15
  Administered 2014-02-13: 60 mg

## 2014-02-13 MED ORDER — SODIUM CHLORIDE 0.9 % IV BOLUS
1000.00 mL | Freq: Once | INTRAVENOUS | Status: AC
Start: 2014-02-13 — End: 2014-02-13
  Administered 2014-02-13: 1000 mL via INTRAVENOUS

## 2014-02-13 MED ORDER — PROPOFOL 10 MG/ML IV EMUL
INTRAVENOUS | Status: AC
Start: 2014-02-13 — End: ?
  Filled 2014-02-13: qty 20

## 2014-02-13 MED ORDER — ONDANSETRON HCL 4 MG/2ML IJ SOLN
4.00 mg | Freq: Three times a day (TID) | INTRAMUSCULAR | Status: DC | PRN
Start: 2014-02-13 — End: 2014-02-13

## 2014-02-13 MED ORDER — FENTANYL CITRATE 0.05 MG/ML IJ SOLN
25.0000 ug | INTRAMUSCULAR | Status: AC | PRN
Start: 2014-02-13 — End: 2014-02-13
  Administered 2014-02-13 (×3): 25 ug via INTRAVENOUS

## 2014-02-13 MED ORDER — SODIUM CHLORIDE 0.9 % IV MBP
4.5000 g | Freq: Three times a day (TID) | INTRAVENOUS | Status: DC
Start: 2014-02-14 — End: 2014-02-15
  Administered 2014-02-14 – 2014-02-15 (×5): 4.5 g via INTRAVENOUS
  Filled 2014-02-13 (×2): qty 100
  Filled 2014-02-13: qty 20
  Filled 2014-02-13 (×2): qty 100
  Filled 2014-02-13 (×3): qty 20
  Filled 2014-02-13: qty 100
  Filled 2014-02-13: qty 20

## 2014-02-13 MED ORDER — ONDANSETRON HCL 4 MG/2ML IJ SOLN
4.0000 mg | Freq: Three times a day (TID) | INTRAMUSCULAR | Status: DC | PRN
Start: 2014-02-13 — End: 2014-02-15

## 2014-02-13 MED ORDER — ENOXAPARIN SODIUM 40 MG/0.4ML SC SOLN
40.0000 mg | Freq: Every day | SUBCUTANEOUS | Status: DC
Start: 2014-02-14 — End: 2014-02-15
  Administered 2014-02-14 – 2014-02-15 (×2): 40 mg via SUBCUTANEOUS
  Filled 2014-02-13 (×2): qty 0.4

## 2014-02-13 MED ORDER — DEXAMETHASONE SODIUM PHOSPHATE 4 MG/ML IJ SOLN (WRAP)
INTRAMUSCULAR | Status: DC | PRN
Start: 2014-02-13 — End: 2014-02-13
  Administered 2014-02-13: 8 mg via INTRAVENOUS

## 2014-02-13 MED ORDER — PROPOFOL INFUSION 10 MG/ML
INTRAVENOUS | Status: DC | PRN
Start: 2014-02-13 — End: 2014-02-13
  Administered 2014-02-13: 200 mg via INTRAVENOUS

## 2014-02-13 MED ORDER — BUPIVACAINE-EPINEPHRINE (PF) 0.25% -1:200000 IJ SOLN
INTRAMUSCULAR | Status: AC
Start: 2014-02-13 — End: ?
  Filled 2014-02-13: qty 30

## 2014-02-13 MED ORDER — HYDROMORPHONE HCL 1 MG/ML IJ SOLN
0.5000 mg | INTRAMUSCULAR | Status: DC | PRN
Start: 2014-02-13 — End: 2014-02-13

## 2014-02-13 MED ORDER — PROMETHAZINE HCL 25 MG/ML IJ SOLN
6.2500 mg | Freq: Once | INTRAMUSCULAR | Status: DC | PRN
Start: 2014-02-13 — End: 2014-02-13

## 2014-02-13 MED ORDER — HYDROMORPHONE HCL 1 MG/ML IJ SOLN
INTRAMUSCULAR | Status: AC
Start: 2014-02-13 — End: ?
  Filled 2014-02-13: qty 1

## 2014-02-13 MED ORDER — MIDAZOLAM HCL 2 MG/2ML IJ SOLN
INTRAMUSCULAR | Status: DC | PRN
Start: 2014-02-13 — End: 2014-02-13
  Administered 2014-02-13: 2 mg via INTRAVENOUS

## 2014-02-13 MED ORDER — LACTATED RINGERS IV SOLN
INTRAVENOUS | Status: DC | PRN
Start: 2014-02-13 — End: 2014-02-13

## 2014-02-13 MED ORDER — NEOSTIGMINE METHYLSULFATE 1 MG/ML IJ SOLN
INTRAMUSCULAR | Status: DC | PRN
Start: 2014-02-13 — End: 2014-02-13
  Administered 2014-02-13: 4 mg via INTRAVENOUS

## 2014-02-13 MED ORDER — HYDROMORPHONE HCL 2 MG PO TABS
2.0000 mg | ORAL_TABLET | ORAL | Status: DC | PRN
Start: 2014-02-13 — End: 2014-02-15
  Administered 2014-02-14 (×5): 2 mg via ORAL
  Filled 2014-02-13 (×5): qty 1

## 2014-02-13 MED ORDER — OXYCODONE-ACETAMINOPHEN 5-325 MG PO TABS
1.0000 | ORAL_TABLET | Freq: Once | ORAL | Status: DC | PRN
Start: 2014-02-13 — End: 2014-02-13

## 2014-02-13 MED ORDER — ONDANSETRON HCL 4 MG/2ML IJ SOLN
INTRAMUSCULAR | Status: DC | PRN
Start: 2014-02-13 — End: 2014-02-13
  Administered 2014-02-13: 4 mg via INTRAVENOUS

## 2014-02-13 MED ORDER — KETOROLAC TROMETHAMINE 15 MG/ML IJ SOLN
15.0000 mg | Freq: Three times a day (TID) | INTRAMUSCULAR | Status: DC | PRN
Start: 2014-02-13 — End: 2014-02-15
  Administered 2014-02-14 – 2014-02-15 (×2): 15 mg via INTRAVENOUS
  Filled 2014-02-13 (×2): qty 1

## 2014-02-13 MED ORDER — ACETAMINOPHEN 325 MG PO TABS
650.0000 mg | ORAL_TABLET | Freq: Four times a day (QID) | ORAL | Status: DC
Start: 2014-02-14 — End: 2014-02-15
  Administered 2014-02-14 – 2014-02-15 (×6): 650 mg via ORAL
  Filled 2014-02-13 (×6): qty 2

## 2014-02-13 MED ORDER — MIDAZOLAM HCL 2 MG/2ML IJ SOLN
INTRAMUSCULAR | Status: AC
Start: 2014-02-13 — End: ?
  Filled 2014-02-13: qty 2

## 2014-02-13 MED ORDER — BUPIVACAINE-EPINEPHRINE (PF) 0.25% -1:200000 IJ SOLN
INTRAMUSCULAR | Status: DC | PRN
Start: 2014-02-13 — End: 2014-02-13
  Administered 2014-02-13: 15 mL via INTRAMUSCULAR

## 2014-02-13 MED ORDER — FENTANYL CITRATE 0.05 MG/ML IJ SOLN
INTRAMUSCULAR | Status: DC | PRN
Start: 2014-02-13 — End: 2014-02-13
  Administered 2014-02-13: 100 ug via INTRAVENOUS

## 2014-02-13 MED ORDER — SODIUM CHLORIDE 0.9 % IV SOLN
INTRAVENOUS | Status: DC
Start: 2014-02-14 — End: 2014-02-15

## 2014-02-13 MED ORDER — ONDANSETRON HCL 4 MG/2ML IJ SOLN
4.0000 mg | Freq: Once | INTRAMUSCULAR | Status: DC | PRN
Start: 2014-02-13 — End: 2014-02-13

## 2014-02-13 MED ORDER — ROCURONIUM BROMIDE 50 MG/5ML IV SOLN
INTRAVENOUS | Status: DC | PRN
Start: 2014-02-13 — End: 2014-02-13
  Administered 2014-02-13: 35 mg via INTRAVENOUS

## 2014-02-13 MED ORDER — GLYCOPYRROLATE 0.2 MG/ML IJ SOLN
INTRAMUSCULAR | Status: AC
Start: 2014-02-13 — End: ?
  Filled 2014-02-13: qty 1

## 2014-02-13 MED ORDER — NEOSTIGMINE METHYLSULFATE 1 MG/ML IJ SOLN
INTRAMUSCULAR | Status: AC
Start: 2014-02-13 — End: ?
  Filled 2014-02-13: qty 10

## 2014-02-13 MED ORDER — MORPHINE SULFATE 10 MG/ML IJ/IV SOLN (WRAP)
6.0000 mg | Freq: Once | Status: AC
Start: 2014-02-13 — End: 2014-02-13
  Administered 2014-02-13: 6 mg via INTRAVENOUS
  Filled 2014-02-13: qty 1

## 2014-02-13 MED ORDER — STERILE WATER FOR INJECTION IJ SOLN
INTRAMUSCULAR | Status: AC
Start: 2014-02-13 — End: ?
  Filled 2014-02-13: qty 10

## 2014-02-13 MED ORDER — SODIUM CHLORIDE 0.9 % IV MBP
4.50 g | Freq: Once | INTRAVENOUS | Status: AC
Start: 2014-02-13 — End: 2014-02-13
  Administered 2014-02-13: 4.5 g via INTRAVENOUS
  Filled 2014-02-13: qty 20
  Filled 2014-02-13: qty 100

## 2014-02-13 MED ORDER — HYDROMORPHONE HCL 1 MG/ML IJ SOLN
INTRAMUSCULAR | Status: AC
Start: 2014-02-13 — End: 2014-02-14
  Filled 2014-02-13: qty 1

## 2014-02-13 MED ORDER — IOHEXOL 300 MG/ML IJ SOLN
INTRAMUSCULAR | Status: AC
Start: 2014-02-13 — End: 2014-02-14
  Filled 2014-02-13: qty 50

## 2014-02-13 SURGICAL SUPPLY — 39 items
ADHESIVE SKIN SURGISEAL .35ML (Suture) ×2 IMPLANT
APPLIER IN CLP TI MED LG LIGAMAX 5MM (Endoscopic Supplies) ×1
APPLIER INTERNAL CLIP MEDIUM LARGE (Endoscopic Supplies) ×1
APPLIER INTERNAL CLIP MEDIUM LARGE TITANIUM ANGLE JAW DISTAL TIP CLOSE (Endoscopic Supplies) ×1 IMPLANT
CABLE HIGH FREQUENCY MONOPOLAR (Cautery) IMPLANT
CANULA STABILITY 5MM (Procedure Accessories) ×2 IMPLANT
CLIP ENDO 10 MM (Laparoscopy Supplies) IMPLANT
GLOVE SURG BIOGEL PF LTX SZ7.0 (Glove) ×2 IMPLANT
GLOVE SURG BIOGEL SZ6.5 (Glove) ×2 IMPLANT
INACTIVE USE LAWSON 120900 (Gown) ×2 IMPLANT
PACK GEN LAPAROSCOPY FFX (Pack) ×2 IMPLANT
PAD ELECTROSRG GRND REM W CRD (Procedure Accessories) ×2 IMPLANT
POUCH SPEC RTRVL PU E-CTCH GLD 10MM 34.5 (Laparoscopy Supplies) ×1
POUCH SPECIMEN RETRIEVAL L34.5 CM (Laparoscopy Supplies) ×1
POUCH SPECIMEN RETRIEVAL L34.5 CM ERGONOMIC HANDLE LONG CYLINDRICAL (Laparoscopy Supplies) ×1 IMPLANT
RELOAD STAPLER 2 MM 2.5 MM 3 MM L60 MM (Staplers) ×1
RELOAD STAPLER 2 MM 2.5 MM 3 MM L60 MM ENDO GIA TITANIUM MEDIUM (Staplers) ×1 IMPLANT
RELOAD STPLR TI 2MM 2.5MM 3MM EGIA 60MM (Staplers) ×1
SHEAR SCALP HARM ACE (Cautery) ×2 IMPLANT
SOLUTION IRR 0.9% NACL 500ML LF STRL PLS (Irrigation Solutions) ×1
SOLUTION IRRIGATION 0.9% SDM CHLORIDE 500ML PR BTTL ISOTONIC NONPRGNC (Irrigation Solutions) ×1 IMPLANT
SOLUTION IRRIGATION 0.9% SODIUM CHLORIDE (Irrigation Solutions) ×1
SOLUTION IV 0.9% NACL 1000ML VFLX LF PLS (IV Solutions) ×1
SOLUTION IV 0.9% SODIUM CHLORIDE PVC (IV Solutions) ×1
SOLUTION IV 0.9% SODIUM CHLORIDE PVC 1000 ML PH 5 PLASTIC CONTAINER (IV Solutions) ×1 IMPLANT
SPONGE CHLRPRP TINT 26ML (Applicator) ×2 IMPLANT
STAPLER INTNL PVC STD UNV EGIA 12MM (Staplers) ×1
STAPLER TISSUE L16 CM X W4 MM OD12 MM (Staplers) ×1
STAPLER TISSUE L16 CM X W4 MM OD12 MM ENDOSCOPIC ENDO GIA PVC INTERNAL (Staplers) ×1 IMPLANT
SUCTION IRRIGATOR 2 WITH TIP (Suction) IMPLANT
SUTURE MONOCRYL 4-0 PS2 27IN (Suture) ×2 IMPLANT
SUTURE VICRYL 0 UR6 27IN (Suture) ×4 IMPLANT
TRAY FOLEY CATHETER 16F (Catheter Micellaneous)
TRAY FOLEY CATHETER 16F (Catheter Miscellaneous) IMPLANT
TROCAR BLADELESS ENDO 5X100MM (Laparoscopy Supplies) ×2 IMPLANT
TROCAR ENDOPATH XCEL BLUNT TIP (Laparoscopy Supplies) ×2 IMPLANT
TROCAR XCEL ENDO TIP 12MM (Laparoscopy Supplies) ×2 IMPLANT
TUBING INSUFFLATION LUER CONNECTOR FILTER HIGH FLOW CLEANFLOW SU1101 (Respiratory Supplies) ×1 IMPLANT
TUBING INSUFFLATION W/CO2 GUAR (Respiratory Supplies) ×2

## 2014-02-13 NOTE — ED Provider Notes (Signed)
EMERGENCY DEPARTMENT HISTORY AND PHYSICAL EXAM     Physician/Midlevel provider first contact with patient: 02/13/14 1758         Date: 02/13/2014  Patient Name: Richard Coleman  Attending Physician:  Ames Dura, DO, FACOEP      History of Presenting Illness     Chief Complaint   Patient presents with   . Abdominal Pain     History Provided By: Pt  Chief Complaint: Abd pain  Onset: 4 am  Timing: constant  Location: right sided  Quality: dull, got sharp after 8 hours  Severity: moderate  Modifying Factors: no relief with Tylenol  Associated sxs: nausea    Additional History: Richard Coleman is a 28 y.o. male presented to the ED with a constant right sided abd pain x 4 am that the pt described as dull. Pt sts that after 8 hours his pain became sharp and more focused on the RLQ. Pt was seen at urgent care and told to come to the ED. Pt also reports nausea. Pt's last PO intake was pasta last night. Wife also had the pasta and has no symptoms. Pt has never had similar symptoms. Pt has never had abd surgery.     Pt denies vomiting, diarrhea, and dysuria.     PCP: Pcp, Noneorunknown, MD      Current Facility-Administered Medications   Medication Dose Route Frequency Provider Last Rate Last Dose   . iohexol (OMNIPAQUE) 300 MG/ML injection              No current outpatient prescriptions on file.       Past Medical History   Past Medical History:  History reviewed. No pertinent past medical history.    Past Surgical History:  History reviewed. No pertinent past surgical history.    Family History:  History reviewed. No pertinent family history.    Social History:  History   Substance Use Topics   . Smoking status: Never Smoker    . Smokeless tobacco: Not on file   . Alcohol Use: Yes      Comment: socially       Allergies:  No Known Allergies    Review of Systems     Review of Systems   Gastrointestinal: Positive for nausea and abdominal pain. Negative for vomiting and diarrhea.   Genitourinary: Negative for dysuria.   All other  systems reviewed and are negative.         Physical Exam     BP 130/81 mmHg  Pulse 92  Temp(Src) 97.9 F (36.6 C) (Oral)  Resp 16  SpO2 99%  Pulse Oximetry Analysis - Normal 97% on RA    Physical Exam   Constitutional: He is oriented to person, place, and time. He appears well-developed and well-nourished. No distress.   HENT:   Head: Normocephalic and atraumatic.   Nose: Nose normal.   Eyes: Conjunctivae are normal.   Neck: Normal range of motion. Neck supple.   Cardiovascular: Normal rate and regular rhythm.    Pulmonary/Chest: Effort normal and breath sounds normal. No stridor.   Abdominal: Soft. He exhibits no distension. There is tenderness (RLQ tenderness). There is guarding. There is no rebound.   Musculoskeletal: Normal range of motion. He exhibits no edema.   Neurological: He is alert and oriented to person, place, and time. He exhibits normal muscle tone.   Skin: Skin is warm and dry. He is not diaphoretic.   Psychiatric: He has a normal mood and affect. His behavior  is normal.   Nursing note and vitals reviewed.          Diagnostic Study Results     Labs -     Results     Procedure Component Value Units Date/Time    UA, Reflex to Microscopic [[id number]]  (Abnormal) Collected:  02/13/14 1709    Specimen Information:  Urine Updated:  02/13/14 1757     Urine Type Clean Catch      Color, UA Amber (A)      Clarity, UA Clear      Specific Gravity UA 1.035      Urine pH 5.0      Leukocyte Esterase, UA Negative      Nitrite, UA Negative      Protein, UR 30 (A)      Glucose, UA Negative      Ketones UA Negative      Urobilinogen, UA Negative mg/dL      Bilirubin, UA Negative      Blood, UA Negative      RBC, UA 0 - 5 /hpf      WBC, UA 0 - 5 /hpf      Urine Mucus Present     Comprehensive Metabolic Panel (CMP) [[id number]]  (Abnormal) Collected:  02/13/14 1647    Specimen Information:  Blood Updated:  02/13/14 1718     Glucose 107 (H) mg/dL      BUN 62.1 mg/dL      Creatinine 0.9 mg/dL      Sodium 308 mEq/L       Potassium 3.9 mEq/L      Chloride 102 mEq/L      CO2 28 mEq/L      CALCIUM 9.5 mg/dL      Protein, Total 7.4 g/dL      Albumin 4.2 g/dL      AST (SGOT) 16 U/L      ALT 22 U/L      Alkaline Phosphatase 54 U/L      Bilirubin, Total 1.3 (H) mg/dL      Globulin 3.2 g/dL      Albumin/Globulin Ratio 1.3      Anion Gap 8.0     Lipase [[id number]] Collected:  02/13/14 1647    Specimen Information:  Blood Updated:  02/13/14 1718     Lipase 23 U/L     Hemolysis index [[id number]] Collected:  02/13/14 1647     Hemolysis Index 11 Updated:  02/13/14 1718    GFR [[id number]] Collected:  02/13/14 1647     EGFR >60.0 Updated:  02/13/14 1718    CBC with differential [[id number]]  (Abnormal) Collected:  02/13/14 1647    Specimen Information:  Blood / Blood Updated:  02/13/14 1656     WBC 13.31 (H) x10 3/uL      Hgb 14.9 g/dL      Hematocrit 25.9 (L) %      Platelets 290 x10 3/uL      RBC 4.84 x10 6/uL      MCV 84.9 fL      MCH 30.8 pg      MCHC 36.3 (H) g/dL      RDW 14 %      MPV 9.8 fL      Neutrophils 82 %      Lymphocytes Automated 10 %      Monocytes 7 %      Eosinophils Automated 1 %      Basophils Automated 0 %  Immature Granulocyte 0 %      Nucleated RBC 0 /100 WBC      Neutrophils Absolute 10.94 (H) x10 3/uL      Abs Lymph Automated 1.27 x10 3/uL      Abs Mono Automated 0.95 x10 3/uL      Abs Eos Automated 0.13 x10 3/uL      Absolute Baso Automated 0.02 x10 3/uL      Absolute Immature Granulocyte 0.02 x10 3/uL           Radiologic Studies -   Radiology Results (24 Hour)     Procedure Component Value Units Date/Time    CT Abd/Pelvis with IV Contrast only [[id number]] Collected:  02/13/14 1855    Order Status:  Completed Updated:  02/13/14 1905    Narrative:      HISTORY: Abdominal pain    COMPARISON: None available.    TECHNIQUE:  Continuous axial images were obtained from the top of the  diaphragms through the pubic symphysis without oral contrast during the  uneventful intravenous injection of non-ionic contrast.  Multiplanar  coronal and sagittal images were created at the CT scanner.      FINDINGS:   Lung bases: The lung bases are clear. There are no pleural effusions.  Liver: No mass.  Biliary tree:No ductal dilatation.  Gallbladder: Present  Spleen: Normal  Pancreas: No mass or duct dilatation.  Adrenals: Normal  Kidneys: Normal  Aorta: Not dilated  Retroperitoneum: No significant adenopathy  GI Tract: Increased density adjacent to the cecum and in the region of  the distal ileum. Abnormal thickening of the appendix and increased  density adjacent to the appendix. Irregular pockets of fluid and air in  the mesentery. No discrete drainable abscess. Findings are consistent  with perforated appendicitis. Small amount of ascites in the pelvis.  Appendix: See above  Peritoneum and mesentery:See above  Bladder: Normal  Pelvis: The prostate and seminal vesicles are normal.  Bones: No significant bony pathology  Abdominal wall: No mass or hernial.      Impression:       Appendicitis with periappendiceal inflammation and small  pockets of fluid and gas.          Lynnae Prude, MD   02/13/2014 7:01 PM        .    Medical Decision Making   I am the first provider for this patient.    I reviewed the vital signs, available nursing notes, past medical history, past surgical history, family history and social history.    Vital Signs-Reviewed the patient's vital signs.     Patient Vitals for the past 12 hrs:   BP Temp Pulse Resp   02/13/14 1918 130/81 mmHg - 92 16   02/13/14 1634 139/77 mmHg 97.9 F (36.6 C) 99 16         Old Medical Records: Nursing notes.         Doctor's Notes     ED Course:   6:15 PM - Pt declined pain or nausea medications.     7:20 PM - Updated pt on results and diagnosis of appendicitis. Still with RLQ tenderness with guarding.    7:38 PM - Discussed pt case with Dr. Ned Grace, general surgeon, who is aware of results and he will take to pt to the OR tonight.      Diagnosis and Treatment Plan       Clinical  Impression:   1. Acute perforated appendicitis  Treatment Plan:   ED Disposition     Admit Admitting Physician: Delena Serve [[id number]]  Diagnosis: Acute perforated appendicitis [578469]  Estimated Length of Stay: > or = to 2 midnights  Tentative Discharge Plan?: Home or Self Care [1]  Patient Class: Inpatient [101]              _______________________________    Attestations:  This note is prepared by Lazarus Salines, acting as scribe for Ames Dura, DO, FACOEP. The scribe's documentation has been prepared under my direction and personally reviewed by me in its entirety.  I confirm that the note above accurately reflects all work, treatment, procedures, and medical decision making performed by me.     I am the first provider for this patient.      Ames Dura, DO, FACOEP is the primary emergency doctor of record.    _______________________________          Ames Dura, DO  02/13/14 2036

## 2014-02-13 NOTE — ED Notes (Signed)
RLQ abd pain, which woke him from sleep at 0400. Nausea, no vomiting or fever.  Seen at Urgent Care and referred to ED for evaluation of possible appendicitis.

## 2014-02-13 NOTE — H&P (Signed)
HISTORY AND PHYSICAL  [facility]      Admission Date: 02/13/2014  Patient Name: Richard Coleman  Attending Physician: No att. providers found  Admitting Service:  General Surgery    Reason for Admission: Abdominal Pain    History of Present Illness:   Richard Coleman is a 28 y.o. male who  has no past medical history on file.Marland Kitchen  He  presented to the hospital with complaints of abdominal pain.  The pain is described as cramping and sharp, and is located in the RLQ without radiation. Onset was 17 hours ago. Symptoms have been gradually worsening since. Aggravating factors: activity and eating.  Alleviating factors: none. Associated symptoms: nausea. 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  He also notes that he has had some GI symptoms with loose stools over the last couple of weeks but denies abdominal pain until today. No fever.  Wbc 13.  Ct scan with thickened inflamed appendix with some fluid adjacent and small pockets of air in mesentery concerning for perforation.    Past Medical History:   History reviewed. No pertinent past medical history.    Past Surgical History:   History reviewed. No pertinent past surgical history.    Family History:   History reviewed. No pertinent family history.    Social History:     History     Social History   . Marital Status: Married     Spouse Name: N/A     Number of Children: N/A   . Years of Education: N/A     Social History Main Topics   . Smoking status: Never Smoker    . Smokeless tobacco: Not on file   . Alcohol Use: Yes      Comment: socially   . Drug Use: No   . Sexual Activity: Not on file     Other Topics Concern   . Not on file     Social History Narrative   . No narrative on file       Allergies:   No Known Allergies    Medications:     Prior to Admission medications    Medication Sig Start Date End Date Taking?  Authorizing Provider   acetaminophen (TYLENOL) 325 MG tablet Take 650 mg by mouth.   Yes [provider]       Review of Systems:   As per the HPI.  The patient denies any additional changes to their otic, opthalmologic, dermatologic, pulmonary, cardiac, gastrointestinal, genitourinary, musculoskeletal, hematologic, constitutional, or psychiatric systems.    Physical Exam:     Filed Vitals:    02/13/14 2100   BP: 138/84   Pulse: 90   Temp: 98.1 F (36.7 C)   Resp: 20   SpO2: 99%       General appearance - alert, well appearing, and in no distress, oriented to person, place, and time and normal appearing weight  Mental status - alert, oriented to person, place, and time  Eyes - pupils equal and reactive, extraocular eye movements intact, sclera anicteric  Ears - bilateral TM's and external ear canals normal  Nose - normal and patent, no erythema, discharge or polyps  Mouth - mucous membranes moist, pharynx normal without lesions  Neck - supple, no significant adenopathy  Chest - clear to auscultation, no wheezes, rales or rhonchi, symmetric air entry  Heart - normal  rate, regular rhythm, normal S1, S2, no murmurs, rubs, clicks or gallops  Abdomen - tenderness noted RLQ without rebound or guarding, no pain on the other side, no rebound  Neurological - alert, oriented, normal speech, no focal findings or movement disorder noted  Musculoskeletal - no joint tenderness, deformity or swelling  Extremities - peripheral pulses normal, no pedal edema, no clubbing or cyanosis  Skin - normal coloration and turgor, no rashes, no suspicious skin lesions noted    Labs:     Results     Procedure Component Value Units Date/Time    UA, Reflex to Microscopic [[id number]]  (Abnormal) Collected:  02/13/14 1709    Specimen Information:  Urine Updated:  02/13/14 1757     Urine Type Clean Catch      Color, UA Amber (A)      Clarity, UA Clear      Specific Gravity UA 1.035      Urine pH 5.0      Leukocyte Esterase, UA Negative       Nitrite, UA Negative      Protein, UR 30 (A)      Glucose, UA Negative      Ketones UA Negative      Urobilinogen, UA Negative mg/dL      Bilirubin, UA Negative      Blood, UA Negative      RBC, UA 0 - 5 /hpf      WBC, UA 0 - 5 /hpf      Urine Mucus Present     Comprehensive Metabolic Panel (CMP) [[id number]]  (Abnormal) Collected:  02/13/14 1647    Specimen Information:  Blood Updated:  02/13/14 1718     Glucose 107 (H) mg/dL      BUN 78.2 mg/dL      Creatinine 0.9 mg/dL      Sodium 956 mEq/L      Potassium 3.9 mEq/L      Chloride 102 mEq/L      CO2 28 mEq/L      CALCIUM 9.5 mg/dL      Protein, Total 7.4 g/dL      Albumin 4.2 g/dL      AST (SGOT) 16 U/L      ALT 22 U/L      Alkaline Phosphatase 54 U/L      Bilirubin, Total 1.3 (H) mg/dL      Globulin 3.2 g/dL      Albumin/Globulin Ratio 1.3      Anion Gap 8.0     Lipase [[id number]] Collected:  02/13/14 1647    Specimen Information:  Blood Updated:  02/13/14 1718     Lipase 23 U/L     Hemolysis index [[id number]] Collected:  02/13/14 1647     Hemolysis Index 11 Updated:  02/13/14 1718    GFR [[id number]] Collected:  02/13/14 1647     EGFR >60.0 Updated:  02/13/14 1718    CBC with differential [[id number]]  (Abnormal) Collected:  02/13/14 1647    Specimen Information:  Blood / Blood Updated:  02/13/14 1656     WBC 13.31 (H) x10 3/uL      Hgb 14.9 g/dL      Hematocrit 03.4 (L) %      Platelets 290 x10 3/uL      RBC 4.84 x10 6/uL      MCV 84.9 fL      MCH 30.8 pg      MCHC 36.3 (H) g/dL      RDW  14 %      MPV 9.8 fL      Neutrophils 82 %      Lymphocytes Automated 10 %      Monocytes 7 %      Eosinophils Automated 1 %      Basophils Automated 0 %      Immature Granulocyte 0 %      Nucleated RBC 0 /100 WBC      Neutrophils Absolute 10.94 (H) x10 3/uL      Abs Lymph Automated 1.27 x10 3/uL      Abs Mono Automated 0.95 x10 3/uL      Abs Eos Automated 0.13 x10 3/uL      Absolute Baso Automated 0.02 x10 3/uL      Absolute Immature Granulocyte 0.02 x10 3/uL           Rads:      Radiology Results (24 Hour)     Procedure Component Value Units Date/Time    CT Abd/Pelvis with IV Contrast only [[id number]] Collected:  02/13/14 1855    Order Status:  Completed Updated:  02/13/14 1905    Narrative:      HISTORY: Abdominal pain    COMPARISON: None available.    TECHNIQUE:  Continuous axial images were obtained from the top of the  diaphragms through the pubic symphysis without oral contrast during the  uneventful intravenous injection of non-ionic contrast. Multiplanar  coronal and sagittal images were created at the CT scanner.      FINDINGS:   Lung bases: The lung bases are clear. There are no pleural effusions.  Liver: No mass.  Biliary tree:No ductal dilatation.  Gallbladder: Present  Spleen: Normal  Pancreas: No mass or duct dilatation.  Adrenals: Normal  Kidneys: Normal  Aorta: Not dilated  Retroperitoneum: No significant adenopathy  GI Tract: Increased density adjacent to the cecum and in the region of  the distal ileum. Abnormal thickening of the appendix and increased  density adjacent to the appendix. Irregular pockets of fluid and air in  the mesentery. No discrete drainable abscess. Findings are consistent  with perforated appendicitis. Small amount of ascites in the pelvis.  Appendix: See above  Peritoneum and mesentery:See above  Bladder: Normal  Pelvis: The prostate and seminal vesicles are normal.  Bones: No significant bony pathology  Abdominal wall: No mass or hernial.      Impression:       Appendicitis with periappendiceal inflammation and small  pockets of fluid and gas.          Lynnae Prude, MD   02/13/2014 7:01 PM            Impression:     Patient Active Problem List   Diagnosis   . Acute perforated appendicitis     RLQ pain with appendicitis on CT scan and likely perforation, but with his history this could be crohn's disease and perforation.    Plan:   Plan for lap appendectomy, possible open, possible ileocectomy. I explained that if this is Crohn's disease, a simple  appendectomy may not be possible.  Risks explained including the increased risk of abscess formation with perforation.  Also concern for fistula formation with h/o possible crohn's disease.  Unforseen events also consented. Patient acknowledged and consented for surgery.  Zosyn therapeutically  scds    Signed by: Celene Squibb , MD [facility]  [facility]  [address]  [place], [place] [address]  [phone]

## 2014-02-13 NOTE — Transfer of Care (Signed)
Anesthesia Transfer of Care Note    Patient: Richard Coleman    Procedures performed: Procedure(s):  LAPAROSCOPY, DIAGNOSTIC  REPAIR, UMBILICAL HERNIA    Anesthesia type: General ETT    Patient location:Phase I PACU    Last vitals:   Filed Vitals:    02/13/14 2216   BP: 130/79   Pulse: 88   Temp: 36.2 C (97.1 F)   Resp: 16   SpO2: 98%       Post pain: Patient not complaining of pain, continue current therapy      Mental Status:awake    Respiratory Function: tolerating room air    Cardiovascular: stable    Nausea/Vomiting: patient not complaining of nausea or vomiting    Hydration Status: adequate    Post assessment: no apparent anesthetic complications

## 2014-02-13 NOTE — Op Note (Signed)
OPERATIVE NOTE    Date Time: 02/13/2014 10:07 PM  Patient Name: Richard Coleman  Attending Physician: No att. providers found    Date of Operation:   02/13/2014    Providers Performing:   Surgeon(s):  Delena Serve, MD    Operative Procedure:   Procedure(s):  LAPAROSCOPY, DIAGNOSTIC  REPAIR, UMBILICAL HERNIA    Preoperative Diagnosis:   Pre-Op Diagnosis Codes:     * Appendicitis with perforation [K35.2]  H/o crohn's disease  Postoperative Diagnosis:   *crohn's ileitis  Umbilical hernia  Indications:   rlq pain with ct scan showing inflammation in mesentery with pockets of air, inflamed appendix concern for perforated appendicitis    Details of Procedure:   Patient after informed consent was taken to the operating room. Anesthesia was administered and he was prepped and draped with chlorprep and scds placed.   He was on therapeutic antibiotics. An infraumbilical incision was made. Dissection bluntly down to the fascia was performed and then the umbilicus was encircled and divided above the hernia sac.  The hernia was reduced and then we used this to insert the 12 mm hassan.  I inserted additional 5mm suprapubic and left lateral ports.  The cecum was floppy in rlq without evidence of inflammation, a very thin tiny noninflamed appendix was noted to go retrocecal and was traced to its tip.  It was normal in appearance.  The terminal ileum however was inflamed just at the ileocecal junction without walled off inflammation for approximately a 8 cm segment. Minimal fluid noted in pelvis.  I opted to avoid an appendectomy in the setting of active crohn;s disease and removed the ports.  I then  Closed the fascia of the umbilicus with 0 vicryl suture and tacked umbilicus back with 3-0 vicryl.   All skin closed with 4-0 monocryl and glue.    The skin was closed after tacking down the umbilicus to the fascia with 3-0 vicryl.  4-0 monocryl was used subcuticularly and surgical glue for the dressing. The patient was extubated  and transferred to the PACU for recover.    Estimated Blood Loss:   < 10 ml    Replaced fluids:   700 ml    Implants:   * No implants in log *    Drains:     None    Specimens:   none    Complications:   * No complications entered in OR log *      Signed by: Delena Serve, MD

## 2014-02-13 NOTE — Anesthesia Preprocedure Evaluation (Signed)
Anesthesia Evaluation    AIRWAY    Mallampati: II    TM distance: >3 FB  Neck ROM: full  Mouth Opening:full   CARDIOVASCULAR    cardiovascular exam normal       DENTAL    no notable dental hx     PULMONARY    pulmonary exam normal     OTHER FINDINGS                      Anesthesia Plan    ASA 1     general               (The most common side effects from anesthesia are nausea, vomiting, sore throat.  Uncommon side effects include but not limited to  injury to eyes, lips, teeth, gums, vocal cords, allergic rections, nerve injuries. In addition there are side effects associated with your medical conditions.    Answered all questions to patient's satisfaction.  Pt understands and wishes to proceed.    Lucylle Foulkes Robert Jaymeson Mengel, MD  08/16/2011 )      intravenous induction           Post op pain management: per surgeon    informed consent obtained    Plan discussed with CRNA.

## 2014-02-14 DIAGNOSIS — K429 Umbilical hernia without obstruction or gangrene: Secondary | ICD-10-CM | POA: Diagnosis present

## 2014-02-14 LAB — MAN DIFF ONLY
Band Neutrophils Absolute: 0 10*3/uL (ref 0.00–1.00)
Band Neutrophils: 0 %
Basophils Absolute Manual: 0 10*3/uL (ref 0.00–0.20)
Basophils Manual: 0 %
Eosinophils Absolute Manual: 0 10*3/uL (ref 0.00–0.70)
Eosinophils Manual: 0 %
Lymphocytes Absolute Manual: 0.25 10*3/uL — ABNORMAL LOW (ref 0.50–4.40)
Lymphocytes Manual: 2 %
Monocytes Absolute: 0.13 10*3/uL (ref 0.00–1.20)
Monocytes Manual: 1 %
Neutrophils Absolute Manual: 12.29 10*3/uL — ABNORMAL HIGH (ref 1.80–8.10)
Nucleated RBC: 0 /100 WBC (ref 0–1)
Segmented Neutrophils: 97 %

## 2014-02-14 LAB — CBC AND DIFFERENTIAL
Hematocrit: 38.6 % — ABNORMAL LOW (ref 42.0–52.0)
Hgb: 13.6 g/dL (ref 13.0–17.0)
MCH: 30.3 pg (ref 28.0–32.0)
MCHC: 35.2 g/dL (ref 32.0–36.0)
MCV: 86 fL (ref 80.0–100.0)
MPV: 10.4 fL (ref 9.4–12.3)
Platelets: 291 10*3/uL (ref 140–400)
RBC: 4.49 10*6/uL — ABNORMAL LOW (ref 4.70–6.00)
RDW: 14 % (ref 12–15)
WBC: 12.67 10*3/uL — ABNORMAL HIGH (ref 3.50–10.80)

## 2014-02-14 LAB — CELL MORPHOLOGY: Cell Morphology: NORMAL

## 2014-02-14 MED ORDER — DOCUSATE SODIUM 100 MG PO CAPS
100.0000 mg | ORAL_CAPSULE | Freq: Two times a day (BID) | ORAL | Status: AC
Start: 2014-02-14 — End: ?

## 2014-02-14 MED ORDER — OXYCODONE-ACETAMINOPHEN 5-325 MG PO TABS
1.0000 | ORAL_TABLET | ORAL | Status: AC | PRN
Start: 2014-02-14 — End: 2014-02-24

## 2014-02-14 MED ORDER — METRONIDAZOLE 500 MG PO TABS
500.0000 mg | ORAL_TABLET | Freq: Three times a day (TID) | ORAL | Status: AC
Start: 2014-02-14 — End: 2014-02-28

## 2014-02-14 MED ORDER — CIPROFLOXACIN HCL 500 MG PO TABS
500.0000 mg | ORAL_TABLET | Freq: Two times a day (BID) | ORAL | Status: AC
Start: 2014-02-14 — End: 2014-02-28

## 2014-02-14 NOTE — Consults (Addendum)
CONSULTATION NOTE    [address], [place], [place] [address]  [phone]  [name] [id number]    Date of admission: 02/13/2014  Date of consult: 02/14/2014    GI Attending Note  Patient seen and evaluated. Agree with plan as below. Continue antibiotics for now and will plan for outpatient GI evaluation for suspicion of Crohn's. Ok to ADAT and for d/c from GI perspective.    Richard Levan II, MD    Assessment:  1. Suspected Crohn's ileitis:  Pt has never been officially dx with Crohn's disease though there was suspicion 2-3 years ago due to multiple anal fistulas/abscess but he never completed workup and has never had colonoscopy.  Ex-lap suggestive of Crohn's ileitis with inflammation at terminal ileum at ileocecal junction for approximately 8 cm.  Continue Abx and switch to Cipro/Flagyl tomorrow.  Will need colonoscopy in future to confirm suspected Crohn's ileitis.   2.  RLQ pain:  Improved.  Likely secondary to Crohn's ileitis.  No evidence of appendicitis during ex-lap.      Active Hospital Problems    Diagnosis   . Umbilical hernia without obstruction and without gangrene   . Crohn's disease involving terminal ileum       Richard Coleman is a 29 y.o. male with PMH recurrent anal fistulas/abscess with suspicion for Crohn's disease but who has never had a colonoscopy who came into the ED with RLQ x 1 day associated with nausea with CT scan suggestive of appendicitis with perforation but ex-lap revealed normal appendix but suspected Crohn's ileitis.   ___________________________  Plan:  1.  Continue IV Zosyn today and if patient continues to do well will switch to po Cipro/Flagyl tomorrow for a 2 week course.    2.  Continue clear liquid diet and advance as tolerated.   3.  Continue supportive care, IVF, pain management, antiemetics.  4.  Will need colonoscopy for formal dx of Crohn's disease and will plan for colonoscopy as outpatient in several weeks once recovered from lap chole.  Will hold off on  prednisone in this setting as just had ex-lap yesterday and does not have formal dx of Crohn's disease.   5.  Will d/w Dr. Saverio Danker Mihelich, PA   3:26 PM    Thank you for allowing [place] to see and participate in this patient's care.  This case will be discussed with Dr. Maple Hudson who will see this patient as well today and will write an accompanying GI consultation and treatment plan.  ________________________    Referring Physician: Dr. Ned Grace    Consulting Physician: Dr. Maple Hudson    Reason for consultation: Crohn's ileitis     Chief complaint: RLQ pain    HPI:  Richard Coleman is a 29 y.o. male with PMH recurrent anal fistulas/abscess with suspicion for Crohn's disease but who has never had a colonoscopy who came into the ED with RLQ x 1 day associated with nausea with CT scan suggestive of appendicitis with perforation but ex-lap revealed normal appendix but suspected Crohn's ileitis.  Patient reports he developed recurrent anal fistulas with anal abscesses 2-3 years ago but never completed workup for suspicion of Crohn's disease due to being busy with his job and moving to [place].  Denies any associated melena, constipation, diarrhea, change in bowel habits, weight loss, fevers/chills, vomiting, dysphagia, GERD sx.  Denies any FH CRC/polyps, IBD or other GI malignancies.  Denies any regular medications.  Social ETOH.  Denies  any tobacco or illicit drug abuse.  Denies NSAIDS/ASA.          Past Medical History   Diagnosis Date   . Crohn disease        Past Surgical History   Procedure Laterality Date   . Treatment fistula anal         No Known Allergies    History     Social History   . Marital Status: Married     Spouse Name: N/A     Number of Children: N/A   . Years of Education: N/A     Occupational History   . Not on file.     Social History Main Topics   . Smoking status: Never Smoker    . Smokeless tobacco: Not on file   . Alcohol Use: Yes      Comment: 6-8oz Whiskey daily X~1 year   . Drug Use: No   .  Sexual Activity:     Partners: Female     Other Topics Concern   . Not on file     Social History Narrative   . No narrative on file       History reviewed. No pertinent family history.    Current Discharge Medication List          Current Facility-Administered Medications   Medication Dose Route Frequency Last Rate Last Dose   . 0.9%  NaCl infusion   Intravenous Continuous 100 mL/hr at 02/14/14 1049     . acetaminophen (TYLENOL) tablet 650 mg  650 mg Oral 4 times per day   650 mg at 02/14/14 1040   . dextrose  5 % and 0.45 % NaCl infusion  100 mL/hr Intravenous Continuous 100 mL/hr at 02/14/14 0045 100 mL/hr at 02/14/14 0045   . enoxaparin (LOVENOX) syringe 40 mg  40 mg Subcutaneous Daily   40 mg at 02/14/14 1037   . HYDROmorphone (DILAUDID) tablet 2 mg  2 mg Oral Q4H PRN   2 mg at 02/14/14 1445   . ketorolac (TORADOL) injection 15 mg  15 mg Intravenous Q8H PRN   15 mg at 02/14/14 1338   . naloxone (NARCAN) injection 0.2 mg  0.2 mg Intravenous PRN       . ondansetron (ZOFRAN-ODT) disintegrating tablet 4 mg  4 mg Oral Q8H PRN        Or   . ondansetron (ZOFRAN) injection 4 mg  4 mg Intravenous Q8H PRN       . piperacillin-tazobactam (ZOSYN) 4.5 g in sodium chloride 0.9 % 100 mL IVPB mini-bag plus  4.5 g Intravenous Q8H 200 mL/hr at 02/14/14 1040 4.5 g at 02/14/14 1040     Facility-Administered Medications Ordered in Other Encounters   Medication Dose Route Frequency Last Rate Last Dose   . lidocaine (XYLOCAINE) 2 % injection    PRN   60 mg at 02/13/14 2130       Review of Systems  Constitutional  Negative for fevers or weight loss   Skin  Negative for rash   HENT  Negative for sore throat   Eyes  Negative for blurred vision   Cardiovascular  Negative for chest pain   Respiratory  Negative for SOB or cough   Gastrointestinal  see HPI   Genitourinary  Negative for dysuria, hematuria, or frequency   Musculoskeletal  Negative for joint pain   Endo  Negative for diabetes   Heme  Negative for anemia   Neurological   Negative  for syncope   Psych  Negative for depression       Physical Exam  BP 111/71 mmHg  Pulse 79  Temp(Src) 97.6 F (36.4 C) (Oral)  Resp 18  Ht 1.93 m (6\' 4" )  Wt 87.091 kg (192 lb)  BMI 23.38 kg/m2  SpO2 96%    General Appearance:    no acute distress, appears stated age and looks comfortable   HEENT:    Normocephalic, without obvious abnormality, atraumatic, PERRL, sclera anicteric, oral mucosa pink/moist   Lungs:     Clear to auscultation bilaterally, no wheezing/rhonchi/rales    Heart:    Regular rate and rhythm, S1 and S2 normal, no murmur, rub or gallops appreciated   Abdomen:    soft, minimal generalized abdominal tenderness with no rebound/guarding, hypoactive BS, no HSM, no abdominal masses/hernias   Rectal:   deferred    Extremities:   Extremities normal, atraumatic, no cyanosis or edema   Skin:   Skin color, texture, turgor normal, no rashes or lesions and no jaundice   Neurologic:   AAOx3, no focal deficits           Psychological: normal affect    Laboratory Data reviewed:      Recent Labs  Lab 02/14/14  0501 02/13/14  1647   WBC 12.67* 13.31*   HEMOGLOBIN 13.6 14.9   HEMATOCRIT 38.6* 41.1*   PLATELETS 291 290   MCV 86.0 84.9   NEUTROPHILS 97 82       Recent Labs  Lab 02/13/14  1647   SODIUM 138   POTASSIUM 3.9   CHLORIDE 102   CO2 28   BUN 12.0   CREATININE 0.9   GLUCOSE 107*   CALCIUM 9.5   PROTEIN, TOTAL 7.4   ALBUMIN 4.2   AST (SGOT) 16   ALT 22   ALKALINE PHOSPHATASE 54   BILIRUBIN, TOTAL 1.3*     Glucose:    Recent Labs  Lab 02/13/14  1647   GLUCOSE 107*     No results for input(s): PT, INR, PTT in the last 168 hours.    Radiological Imaging reviewed:  CT scan ab/pelvis 02/14/13  IMPRESSION:   Appendicitis with periappendiceal inflammation and small  pockets of fluid and gas.

## 2014-02-14 NOTE — Progress Notes (Signed)
POST OPERATIVE PROGRESS NOTE  [facility]    Date Time: 02/14/2014 11:40 AM  Patient Name: Richard Coleman, Richard Coleman      ASSESSMENT:   1 Day Post-Op S/P Procedure(s):  LAPAROSCOPY, DIAGNOSTIC  REPAIR, UMBILICAL HERNIA  Patient with crohn;s ileitis.  Feels well. Tolerating clears.  No fevers.  Wbc 12  PLAN:   GI to see today  Advance diet per GI  Continue zosyn for now-- will need outpatient antibiotics-- defer to GI ( possibly cipro and flagyl ) for couple weeks.  Outpatient follow up with GI  Discharge home when ok with GI and tolerating low residue diet  Follow up in office in 2 weeks        SUBJECTIVE:   The patient is doing well.  Post operative pain is well controlled with medications.  Current dietary status:  Diet clear liquid and tolerating well.  Flatus: yes. BM:  no.  Additional complaints: none       OBJECTIVE:   Current Vitals:   Filed Vitals:    02/14/14 0945   BP: 118/54   Pulse: 87   Temp: 96.5 F (35.8 C)   Resp: 18   SpO2: 97%       Intake and Output Summary (Last 24 hours):  I/O last 3 completed shifts:  In: 2561 [P.O.:1136; I.V.:1325; IV Piggyback:100]  Out: 1575 [Urine:1575]    Labs:     Results     Procedure Component Value Units Date/Time    CBC and differential (QAMLAB x 1) [[id number]]  (Abnormal) Collected:  02/14/14 0501    Specimen Information:  Blood / Blood Updated:  02/14/14 0756     WBC 12.67 (H) x10 3/uL      Hgb 13.6 g/dL      Hematocrit 40.9 (L) %      Platelets 291 x10 3/uL      RBC 4.49 (L) x10 6/uL      MCV 86.0 fL      MCH 30.3 pg      MCHC 35.2 g/dL      RDW 14 %      MPV 10.4 fL     Manual Differential [[id number]]  (Abnormal) Collected:  02/14/14 0501     Segmented Neutrophils 97 % Updated:  02/14/14 0756     Band Neutrophils 0 %      Lymphocytes Manual 2 %      Monocytes Manual 1 %      Eosinophils Manual 0 %      Basophils Manual 0 %      Nucleated RBC 0 /100 WBC      Abs Seg Manual 12.29 (H) x10 3/uL      Bands Absolute 0.00 x10 3/uL      Absolute Lymph Manual 0.25 (L)  x10 3/uL      Monocytes Absolute 0.13 x10 3/uL      Absolute Eos Manual 0.00 x10 3/uL      Absolute Baso Manual 0.00 x10 3/uL     Cell MorpHology [[id number]] Collected:  02/14/14 0501     Cell Morphology: Normal Updated:  02/14/14 0756    UA, Reflex to Microscopic [[id number]]  (Abnormal) Collected:  02/13/14 1709    Specimen Information:  Urine Updated:  02/13/14 1757     Urine Type Clean Catch      Color, UA Amber (A)      Clarity, UA Clear      Specific Gravity UA 1.035      Urine pH  5.0      Leukocyte Esterase, UA Negative      Nitrite, UA Negative      Protein, UR 30 (A)      Glucose, UA Negative      Ketones UA Negative      Urobilinogen, UA Negative mg/dL      Bilirubin, UA Negative      Blood, UA Negative      RBC, UA 0 - 5 /hpf      WBC, UA 0 - 5 /hpf      Urine Mucus Present     Comprehensive Metabolic Panel (CMP) [[id number]]  (Abnormal) Collected:  02/13/14 1647    Specimen Information:  Blood Updated:  02/13/14 1718     Glucose 107 (H) mg/dL      BUN 13.2 mg/dL      Creatinine 0.9 mg/dL      Sodium 440 mEq/L      Potassium 3.9 mEq/L      Chloride 102 mEq/L      CO2 28 mEq/L      CALCIUM 9.5 mg/dL      Protein, Total 7.4 g/dL      Albumin 4.2 g/dL      AST (SGOT) 16 U/L      ALT 22 U/L      Alkaline Phosphatase 54 U/L      Bilirubin, Total 1.3 (H) mg/dL      Globulin 3.2 g/dL      Albumin/Globulin Ratio 1.3      Anion Gap 8.0     Lipase [[id number]] Collected:  02/13/14 1647    Specimen Information:  Blood Updated:  02/13/14 1718     Lipase 23 U/L     Hemolysis index [[id number]] Collected:  02/13/14 1647     Hemolysis Index 11 Updated:  02/13/14 1718    GFR [[id number]] Collected:  02/13/14 1647     EGFR >60.0 Updated:  02/13/14 1718    CBC with differential [[id number]]  (Abnormal) Collected:  02/13/14 1647    Specimen Information:  Blood / Blood Updated:  02/13/14 1656     WBC 13.31 (H) x10 3/uL      Hgb 14.9 g/dL      Hematocrit 16.6 (L) %      Platelets 290 x10 3/uL      RBC 4.84 x10 6/uL      MCV 84.9 fL       MCH 30.8 pg      MCHC 36.3 (H) g/dL      RDW 14 %      MPV 9.8 fL      Neutrophils 82 %      Lymphocytes Automated 10 %      Monocytes 7 %      Eosinophils Automated 1 %      Basophils Automated 0 %      Immature Granulocyte 0 %      Nucleated RBC 0 /100 WBC      Neutrophils Absolute 10.94 (H) x10 3/uL      Abs Lymph Automated 1.27 x10 3/uL      Abs Mono Automated 0.95 x10 3/uL      Abs Eos Automated 0.13 x10 3/uL      Absolute Baso Automated 0.02 x10 3/uL      Absolute Immature Granulocyte 0.02 x10 3/uL           Rads:     Radiology Results (24 Hour)     Procedure Component Value  Units Date/Time    CT Abd/Pelvis with IV Contrast only [[id number]] Collected:  02/13/14 1855    Order Status:  Completed Updated:  02/13/14 1905    Narrative:      HISTORY: Abdominal pain    COMPARISON: None available.    TECHNIQUE:  Continuous axial images were obtained from the top of the  diaphragms through the pubic symphysis without oral contrast during the  uneventful intravenous injection of non-ionic contrast. Multiplanar  coronal and sagittal images were created at the CT scanner.      FINDINGS:   Lung bases: The lung bases are clear. There are no pleural effusions.  Liver: No mass.  Biliary tree:No ductal dilatation.  Gallbladder: Present  Spleen: Normal  Pancreas: No mass or duct dilatation.  Adrenals: Normal  Kidneys: Normal  Aorta: Not dilated  Retroperitoneum: No significant adenopathy  GI Tract: Increased density adjacent to the cecum and in the region of  the distal ileum. Abnormal thickening of the appendix and increased  density adjacent to the appendix. Irregular pockets of fluid and air in  the mesentery. No discrete drainable abscess. Findings are consistent  with perforated appendicitis. Small amount of ascites in the pelvis.  Appendix: See above  Peritoneum and mesentery:See above  Bladder: Normal  Pelvis: The prostate and seminal vesicles are normal.  Bones: No significant bony pathology  Abdominal wall: No  mass or hernial.      Impression:       Appendicitis with periappendiceal inflammation and small  pockets of fluid and gas.          Lynnae Prude, MD   02/13/2014 7:01 PM            Physical Exam:     Mental status - alert, oriented to person, place, and time  Chest - clear to auscultation, no wheezes, rales or rhonchi, symmetric air entry  Heart - normal rate, regular rhythm, normal S1, S2, no murmurs, rubs, clicks or gallops  Abdomen - tenderness noted at incisions and mild rlq    Wound - clean, dry, no drainage  Extremities - peripheral pulses normal, no pedal edema, no clubbing or cyanosis      Signed by: Delena Serve

## 2014-02-14 NOTE — Progress Notes (Signed)
VTE/PE Risk Screening  Complete Upon Admission and Transfer to Different Level of Care  Completed by nurse: Rosaland Lao Roel Douthat 02/14/2014 1:42 AM   -----------------------------------------------------------------------------------------------------------  SECTION 1 - Risk Screening     [x]   Patient currently receiving anticoagulation therapy (Heparin, Lovenox, Coumadin, Pradaxa, Xarelto, Eliquis or Arixtra Only) and received 1 dose within 24 hours of admission STOP HERE   []   VTE/PE prophylaxis currently prescribed elsewhere - STOP HERE   []   Comfort Care - STOP HERE   []   Clinical Trials - STOP HERE    Contraindications: Patients with a history of the following conditions cannot haveSequential compression devices (SCD     []  Any of these conditions present , Call MD for pharmacological prophylaxis or ask MD to document reason for not having both mechanical and pharmacologic VTE prophylaxis   []  Post-op vein ligation   []  Suspected VTE   []  Cellulitis/Dermatitis of the leg   []  Severe ischemic Vascular disease   []  Edema related to Congestive Heart Faliure   []  Gangrene   []  Recent skin graft  -----------------------------------------------------------------------------------------------------------  SECTION 2 - Risk Factors (Check all that apply)    Moderate Risk Factors   []   Heart Failure (current or history of)   []   Respiratory Failure   []   Acute Myocardial Infarction (AMI)   []   Acute Infection   []   Rheumatologic Disorder   []   Elderly age (29 years old)   []   Ongoing hormonal treatment / estrogen use (including Tamoxifen, Raloxifene)   []   Obesity (BMI >/= 30kg/m2)    High Risk Factors   []   Recent (</= 1 month) trauma/surgery    Highest Risk Factors   []   Active Cancer   []   Previous VTE   [x]   Reduced mobility (>24 hrs; current or anticipated)   []   Known thrombophilic condition (hematological disorders that promote thrombosis)      []   No boxes checked in this section indicate patient is at low risk for VTE.  No VTE Prophylaxis indicated.       [x]   One or more risk factors present, enter an EPIC order for Sequential compression devices (SCD). Use per protocol, MD signature required.

## 2014-02-14 NOTE — Plan of Care (Signed)
Problem: Altered GI Function  Goal: Fluid and electrolyte balance are achieved/maintained  Outcome: Progressing  Status: pt AOX4. POD1 lap appe. 3 lap sites open to air clean, dry, no drainage  w/ redness around incision site. Active bowel sounds in all 4 quads. Pt denies any N/V at this time. Continues on IVF. ABx Zosyn. Voiding. Tolerating well PO intake. Will likely advance diet in the am.  Plan: will continue to monitor fluid and electrolytes balance.

## 2014-02-14 NOTE — Plan of Care (Addendum)
Problem: Health Promotion  Goal: Vaccination Screening  All patients will be screened for current vaccination status on each admission.   Outcome: Completed Date Met:  02/14/14  Goal: Knowledge - disease process  Extent of understanding conveyed about a specific disease process.   Outcome: Progressing  Goal: Risk control - tobacco abuse  Actions to eliminate or reduce tobacco use.   Outcome: Completed Date Met:  02/14/14  Goal: Knowledge - health resources  Extent of understanding and conveyed about healthcare resources.   Outcome: Progressing    Problem: Safety  Goal: Patient will be free from injury during hospitalization  Outcome: Progressing    Problem: Pain  Goal: Patient's pain/discomfort is manageable  Outcome: Progressing    Problem: Psychosocial and Spiritual Needs  Goal: Demonstrates ability to cope with hospitalization/illness  Outcome: Completed Date Met:  02/14/14    Problem: Altered GI Function  Goal: Fluid and electrolyte balance are achieved/maintained  Outcome: Progressing  Goal: Elimination patterns are normal or improving  Outcome: Progressing  Goal: Nutritional intake is adequate  Outcome: Progressing  Goal: Mobility/Activity is maintained at optimal level for patient  Outcome: Progressing  Goal: Patient will have regular and routine bowel evacuation  Outcome: Progressing    Problem: Moderate/High Fall Risk Score >/=15  Goal: Patient will remain free of falls  Outcome: Progressing    Pt received from PACU @~2325 via stretcher s/p ex-lap and umbilical hernia repair.  Pt a&o X4.  VSS.  BS CTA.  97% on 2L O2 via NC.  Encouraged turn, cough, and deep breathing and incentive spirometry use.  Tolerating CLD and whole pills.  Denies N.  Reported abd distention and tenderness.  On scheduled Tylenol ATC.  Hypoactive BS.  Belching and passing a small amount of gas.  LBM on 02/13/14.  Adequate UOP.  Lap sites/abd incision sites X3 with surgical glue, OTA, and C/D/I.  Pt ambulated 50 feet with standby guard  from outside to inside of room.  Encouraged ambulation in hallway.  D5 1/2 NS @100  cc/hr infusing via PIV.  Pt and significant other oriented to unit, room, and call bell.  All questions and concerns addressed. Moderate falls risk interventions initiated.  Will continue to monitor pt closely.

## 2014-02-14 NOTE — Plan of Care (Signed)
Problem: Safety  Goal: Patient will be free from injury during hospitalization  Outcome: Progressing  Pt verbalizes and demonstrates understanding of fall prevention and safety plan. Ambulating with assistance. Hourly rounding to anticipate pt needs. Call bell, phone, and belongings within reach.    Problem: Pain  Goal: Patient's pain/discomfort is manageable  Outcome: Progressing  Pt reports 3-7/10 pain intermittently, reports adequate pain relief from current interventions. Dilaudid given x 3 with good effects, toradol x 1. VSS on RA. Able to ambulate and complete ADLs.    Problem: Altered GI Function  Goal: Fluid and electrolyte balance are achieved/maintained  Outcome: Progressing  Labs stable. Tolerating regular diet well. AUO.

## 2014-02-15 LAB — CBC AND DIFFERENTIAL
Basophils Absolute Automated: 0.01 10*3/uL (ref 0.00–0.20)
Basophils Automated: 0 %
Eosinophils Absolute Automated: 0.07 10*3/uL (ref 0.00–0.70)
Eosinophils Automated: 0 %
Hematocrit: 34.7 % — ABNORMAL LOW (ref 42.0–52.0)
Hgb: 12.1 g/dL — ABNORMAL LOW (ref 13.0–17.0)
Immature Granulocytes Absolute: 0.03 10*3/uL
Immature Granulocytes: 0 %
Lymphocytes Absolute Automated: 1.35 10*3/uL (ref 0.50–4.40)
Lymphocytes Automated: 10 %
MCH: 30 pg (ref 28.0–32.0)
MCHC: 34.9 g/dL (ref 32.0–36.0)
MCV: 85.9 fL (ref 80.0–100.0)
MPV: 10.3 fL (ref 9.4–12.3)
Monocytes Absolute Automated: 0.85 10*3/uL (ref 0.00–1.20)
Monocytes: 6 %
Neutrophils Absolute: 10.76 10*3/uL — ABNORMAL HIGH (ref 1.80–8.10)
Neutrophils: 82 %
Nucleated RBC: 0 /100 WBC (ref 0–1)
Platelets: 278 10*3/uL (ref 140–400)
RBC: 4.04 10*6/uL — ABNORMAL LOW (ref 4.70–6.00)
RDW: 14 % (ref 12–15)
WBC: 13.04 10*3/uL — ABNORMAL HIGH (ref 3.50–10.80)

## 2014-02-15 NOTE — Progress Notes (Signed)
PT  ALERT AND ORIENTED X 3. FAMILY HERE TO TAKE PT HOME .  [place] VIA W/C  WITH TRANSPORT ESCORT. [facility] INSTRUCTIONS REVIEWED WITH PT. NO QUESTIONS AT THIS TIME.

## 2014-02-15 NOTE — Discharge Instructions (Addendum)
No lifting over 15 lbs x 6 weeks  No abdominal exercise x 6 weeks  May shower in 36 hours, allowing soap and water to run over incisions, but only pat them dry, do not scrub at them.  Do not remove glue on skin  Apply ice pack to incision area for 20 minutes ever 2 hours x 24-48 hours  May drive when off narcotic pain for 24 hours meds and comfortable      Narcotic medications cause constipation. I recommend taking ibuprofen 600 mg every 6 hours or naproxen  Or aleve ( 2 tabs of 220mg  ) every 12 hours for your baseline medication and then the narcotic (percocet or vicodin) as a breakthrough medication for pain. Do not take ibuprofen if you have a history of stomach ulcers or are allergic to aspirin.     Purchase a stool softener to take while on the narcotic medication and use it twice a day to avoid constipation. Stop if having loose stools.    Try to stay on a bland diet/low fiber diet. Avoid ruffuage.     Date Time: 02/15/2014 12:37 PM  Attending Physician: Delena Serve, MD    Date of Admission:   02/13/2014    Reason for Admission:   Acute perforated appendicitis [K35.2]  Acute appendicitis, unspecified acute appendicitis type [K35.80]    Medications:  . Your medications have been listed for you on the Medication Reconciliation Discharge Home List. Please bring a copy of all discharge instructions, including your Medication Reconciliation Discharge Home List when you visit your physician.    . Continue taking all medications even if you feel well, unless otherwise instructed by physician.  . Do not take any over-the-counter medications or herbal supplements without checking with your pharmacist or doctor.     Activity:  . Rise slowly from a sitting or lying position. Increase activity slowly, unless otherwise instructed by physician.  Marland Kitchen Perform exercises as desginated by Therapist and Physician.  . In the event of severe shortness of breath or chest discomfort, call 911. Do NOT drive to the  hospital.  . Speak with your physician regarding specific driving and/or work restrictions.    Diet:  . If you have special diet orders, you have been given printed diet instructions.   ________________________________________________________________________    Tobacco Cessation Counseling:  If you are currently a tobacco user or have used tobacco within the last 12 months, we have provided you with written Tobacco Cessation Counseling.  ________________________________________________________________________    Heart Failure Education:  If you have a diagnosis of a Heart Failure, we have provided you with written Heart Failure Education.   . Weigh yourself once a day at the same time. Record and bring the weight record to your next physician appointment.   . Call your doctor if you gain more than 3 pounds in one day or 5 pounds in one week, or if you experience shortness of breath, leg swelling and/or chest discomfort.   . Enroll in the [employer] Tel-Assurance Program, a heart failure patient support program. Call [phone] for additional details.   ________________________________________________________________________    Diamond Nickel Education:  . Call 911 for:  Marland Kitchen Sudden numbness or weakness of the face  . Sudden numbness of the arm or leg especially one side of the body  . Sudden confusion, trouble speaking or understanding  . Sudden trouble seeing in one or both eyes  . Sudden trouble walking or dizziness, loss of balance or coordination      .  For promotion of your health, we have provided you with personalized written education on risk factors specific to your diagnosis, including but not limited to:  Marland Kitchen High Blood Pressure  . High Cholesterol  . Atrial Fibrillation  . Overweight  . Diabetes  . Smoking         Vaccinations  Pneumonia Vaccine Received on:  Flu Vaccine Received on:    Signed by: Darrell Jewel, RN    I HAVE RECEIVED AND UNDERSTAND THESE DISCHARGE INSTRUCTIONS.

## 2014-02-15 NOTE — Progress Notes (Addendum)
POST OPERATIVE PROGRESS NOTE  [facility]    Date Time: 02/15/2014 9:03 AM  Patient Name: Richard Coleman, Richard Coleman      ASSESSMENT:   2 Day Post-Op S/P Procedure(s):  LAPAROSCOPY, DIAGNOSTIC  REPAIR, UMBILICAL HERNIA  Likely Crohn's Ileitis. WBC 13 from 12 on Zosyn. Tolerating Regular diet but distended  PLAN:   -continue Regular diet  -switch to Cipro/flagyl  -[place] home today if tolerates breakfast/lunch    SUBJECTIVE:   The patient is doing well.  Post operative pain is well controlled with medications.  Current dietary status:  Diet regular and tolerating well.  Flatus: yes. BM:  no.  Additional complaints: none Having some burping.        OBJECTIVE:   Current Vitals:   Filed Vitals:    02/15/14 0700   BP: 108/70   Pulse: 70   Temp: 97.6 F (36.4 C)   Resp: 18   SpO2:        Intake and Output Summary (Last 24 hours):  I/O last 3 completed shifts:  In: 76 [P.O.:5736; I.V.:2420; IV Piggyback:100]  Out: 2375 [Urine:2375]    Labs:     Results     Procedure Component Value Units Date/Time    CBC and differential (QAMLAB x 1) [[id number]]  (Abnormal) Collected:  02/15/14 0407    Specimen Information:  Blood / Blood Updated:  02/15/14 0538     WBC 13.04 (H) x10 3/uL      Hgb 12.1 (L) g/dL      Hematocrit 46.9 (L) %      Platelets 278 x10 3/uL      RBC 4.04 (L) x10 6/uL      MCV 85.9 fL      MCH 30.0 pg      MCHC 34.9 g/dL      RDW 14 %      MPV 10.3 fL      Neutrophils 82 %      Lymphocytes Automated 10 %      Monocytes 6 %      Eosinophils Automated 0 %      Basophils Automated 0 %      Immature Granulocyte 0 %      Nucleated RBC 0 /100 WBC      Neutrophils Absolute 10.76 (H) x10 3/uL      Abs Lymph Automated 1.35 x10 3/uL      Abs Mono Automated 0.85 x10 3/uL      Abs Eos Automated 0.07 x10 3/uL      Absolute Baso Automated 0.01 x10 3/uL      Absolute Immature Granulocyte 0.03 x10 3/uL           Rads:     Radiology Results (24 Hour)     ** No results found for the last 24 hours. **          Physical Exam:      Mental status - alert, oriented to person, place, and time  Chest - non-labored breathing  Abdomen - tenderness noted at incisions and mild rlq, distended  Wound - clean, dry, no drainage  Extremities - MAE  Neuro-grossly intact    Elsie Lincoln MD, MPH PGY-5  [facility]  General Surgery Resident    Patient seen and examined.  Agree with plan as written.  Crohn's disease.  Follow up 2 weeks with surgery and with gi in 1-2 weeks    Delena Serve , MD [facility]  [facility]  [phone]  [address]  [place], [place] [address]  [phone]

## 2014-02-15 NOTE — Plan of Care (Signed)
Problem: Safety  Goal: Patient will be free from injury during hospitalization  Outcome: Progressing  STATUS;  PT  ALERT  AND  ORIENTED  X 4.  TOLERATING  DIET.   WBC  13.4.  M.D.  IN  TO  SEE PT  THIS  A.M.  3  LAP  SITED  DRY  AND  INTACT.  PLAN;  TO  D.C.  PT  TODAY  AFTER  LUNCH. KEEP  ROOM  CLUTTER  FREE  AT  ALL  TIMES.  MOVE  LINES  AND  TUBING OFF  FLOOR.

## 2014-02-15 NOTE — Plan of Care (Signed)
Problem: Pain  Goal: Patient's pain/discomfort is manageable  Outcome: Progressing  Status:Pt educated on numeric pain scale and verbalized understanding. Pt educated on prn dilaudid and Toradol for pain and verbalized understanding. Pt complains of abdominal pain and right shoulder pain. Toradol is effective for the shoulder pain as per pt. Denies chest pain. Pt denies any side effect at this time.     Plan: will continue to assess and reassess pain level, offer non-pharmacological interventions and medicate as needed.

## 2014-02-15 NOTE — Discharge Summary (Signed)
DISCHARGE NOTE    Date Time: 02/15/2014 10:32 AM  Patient Name: Richard Coleman  Attending Physician: Delena Serve, MD      Date of Admission:   02/13/2014    Date of Discharge:   02/15/13    Reason for Admission:   Suspected Appendicitis    Discharge Dx:   Inflammation of Terminal Ileum, Suspected Crohn's    Consultations:   GI: Dr. Earlean Shawl    Procedures performed:   02/13/14: Diagnostic Laparoscopy, Repair of Umbilical Hernia, No appendectomy due to inflammation of TI    HPI:   Richard Coleman is a 29 y.o. male who  has no past medical history on file.Marland Kitchen He presented to the hospital with complaints of abdominal pain. The pain is described as cramping and sharp, and is located in the RLQ without radiation. Onset was 17 hours ago. Symptoms have been gradually worsening since. Aggravating factors: activity and eating. Alleviating factors: none. Associated symptoms: nausea. 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 He also notes that he has had some GI symptoms with loose stools over the last couple of weeks but denies abdominal pain until today. No fever. Wbc 13. Ct scan with thickened inflamed appendix with some fluid adjacent and small pockets of air in mesentery concerning for perforation.     Hospital Course:   Patient was admitted with suspected appendicitis. On 02/13/14, he went to the operating room and had a diagnostic laparoscopy which showed inflammation of the terminal ileum and normal appendix and as a result an appendectomy was not performed since Crohn's was suspected. An umbilical hernia was repaired. GI was consulted post-op and he was maintained on zosyn. His diet was slowly advanced. By POD 2, he was tolerating a bland diet and was deemed appropriate for discharge with 2 week course of cipro/flagyl and plans to follow up with GI.     Discharge Medications:      Current Discharge Medication List      START taking these medications    Details   ciprofloxacin (CIPRO) 500 MG tablet Take 1 tablet (500 mg total) by mouth 2 (two) times daily.  Qty: 28 tablet, Refills: 0      docusate sodium (COLACE) 100 MG capsule Take 1 capsule (100 mg total) by mouth 2 (two) times daily. Stop if having loose stools  Refills: 0      metroNIDAZOLE (FLAGYL) 500 MG tablet Take 1 tablet (500 mg total) by mouth 3 (three) times daily.  Qty: 42 tablet, Refills: 0      oxyCODONE-acetaminophen (PERCOCET) 5-325 MG per tablet Take 1-2 tablets by mouth every 4 (four) hours as needed for Pain.  Qty: 30 tablet, Refills: 0         STOP taking these medications       acetaminophen (TYLENOL) 325 MG tablet Comments:   Reason for Stopping:               Disposition:   Home     Discharge Instructions:   Activity: activity as tolerated, no driving while on analgesics and No heavy lifting.   Diet: bland/low fiber diet  Wound Care: Keep wound clean/dry    Follow Up:   Delena Serve, MD  [address] [place]  [address]  [place] [address]  [phone]    Schedule an appointment as soon as possible for a visit  in 2 weeks      Young, Jesusita Oka, MD  [address]  [address]  [place] [address]  [phone]      follow up in 1-2 weeks      Elsie Lincoln MD, MPH PGY-5  [facility]  General Surgery Resident  [phone]  Spectra x [phone] Piedad Climes)

## 2014-02-17 ENCOUNTER — Encounter: Payer: Self-pay | Admitting: Surgery

## 2014-04-03 ENCOUNTER — Emergency Department
Admission: EM | Admit: 2014-04-03 | Discharge: 2014-04-03 | Disposition: A | Discharge status: Home / Self Care | Payer: No Typology Code available for payment source | Attending: Emergency Medicine | Admitting: Emergency Medicine

## 2014-04-03 ENCOUNTER — Emergency Department: Payer: No Typology Code available for payment source

## 2014-04-03 DIAGNOSIS — K5000 Crohn's disease of small intestine without complications: Secondary | ICD-10-CM | POA: Insufficient documentation

## 2014-04-03 LAB — CBC AND DIFFERENTIAL
Basophils Absolute Automated: 0.01 10*3/uL (ref 0.00–0.20)
Basophils Automated: 0 %
Eosinophils Absolute Automated: 0.32 10*3/uL (ref 0.00–0.70)
Eosinophils Automated: 3 %
Hematocrit: 38.7 % — ABNORMAL LOW (ref 42.0–52.0)
Hgb: 13.7 g/dL (ref 13.0–17.0)
Immature Granulocytes Absolute: 0.02 10*3/uL
Immature Granulocytes: 0 %
Lymphocytes Absolute Automated: 1.03 10*3/uL (ref 0.50–4.40)
Lymphocytes Automated: 11 %
MCH: 30 pg (ref 28.0–32.0)
MCHC: 35.4 g/dL (ref 32.0–36.0)
MCV: 84.9 fL (ref 80.0–100.0)
MPV: 9.8 fL (ref 9.4–12.3)
Monocytes Absolute Automated: 0.78 10*3/uL (ref 0.00–1.20)
Monocytes: 8 %
Neutrophils Absolute: 7.34 10*3/uL (ref 1.80–8.10)
Neutrophils: 77 %
Nucleated RBC: 0 /100 WBC (ref 0–1)
Platelets: 277 10*3/uL (ref 140–400)
RBC: 4.56 10*6/uL — ABNORMAL LOW (ref 4.70–6.00)
RDW: 14 % (ref 12–15)
WBC: 9.48 10*3/uL (ref 3.50–10.80)

## 2014-04-03 LAB — GFR: EGFR: >60

## 2014-04-03 LAB — POCT URINALYSIS AUTOMATED (IAH)
Bilirubin, UA POCT: NEGATIVE
Blood, UA POCT: NEGATIVE
Glucose, UA POCT: NEGATIVE
Ketones, UA POCT: NEGATIVE mg/dL
Nitrite, UA POCT: NEGATIVE
PH, UA POCT: 6.5 (ref 4.6–8)
Protein, UA POCT: NEGATIVE mg/dL
Specific Gravity, UA POCT: 1.02 mg/dL (ref 1.001–1.035)
Urobilinogen, UA POCT: 0.2 mg/dL

## 2014-04-03 LAB — COMPREHENSIVE METABOLIC PANEL
ALT: 25 U/L (ref 0–55)
AST (SGOT): 22 U/L (ref 5–34)
Albumin/Globulin Ratio: 1.1 (ref 0.9–2.2)
Albumin: 3.6 g/dL (ref 3.5–5.0)
Alkaline Phosphatase: 51 U/L (ref 38–106)
Anion Gap: 9 (ref 5.0–15.0)
BUN: 12 mg/dL (ref 9.0–28.0)
Bilirubin, Total: 0.6 mg/dL (ref 0.2–1.2)
CO2: 26 mEq/L (ref 22–29)
Calcium: 9.1 mg/dL (ref 8.5–10.5)
Chloride: 104 mEq/L (ref 100–111)
Creatinine: 0.9 mg/dL (ref 0.7–1.3)
Globulin: 3.3 g/dL (ref 2.0–3.6)
Glucose: 113 mg/dL — ABNORMAL HIGH (ref 70–100)
Potassium: 4.8 mEq/L (ref 3.5–5.1)
Protein, Total: 6.9 g/dL (ref 6.0–8.3)
Sodium: 139 mEq/L (ref 136–145)

## 2014-04-03 LAB — URINALYSIS, REFLEX TO MICROSCOPIC EXAM IF INDICATED
Bilirubin, UA: NEGATIVE
Blood, UA: NEGATIVE
Glucose, UA: NEGATIVE
Ketones UA: NEGATIVE
Nitrite, UA: NEGATIVE
Protein, UR: NEGATIVE
Specific Gravity UA: 1.018 (ref 1.001–1.035)
Urine pH: 6 (ref 5.0–8.0)
Urobilinogen, UA: NEGATIVE mg/dL

## 2014-04-03 LAB — HEMOLYSIS INDEX: Hemolysis Index: 124 — ABNORMAL HIGH (ref 0–18)

## 2014-04-03 LAB — LIPASE: Lipase: 40 U/L (ref 8–78)

## 2014-04-03 MED ORDER — MORPHINE SULFATE 2 MG/ML IJ/IV SOLN (WRAP)
4.0000 mg | Freq: Once | Status: AC
Start: 2014-04-03 — End: 2014-04-03
  Administered 2014-04-03: 4 mg via INTRAVENOUS
  Filled 2014-04-03: qty 2

## 2014-04-03 MED ORDER — IOHEXOL 350 MG/ML IV SOLN
100.0000 mL | Freq: Once | INTRAVENOUS | Status: AC | PRN
Start: 2014-04-03 — End: 2014-04-03
  Administered 2014-04-03: 100 mL via INTRAVENOUS

## 2014-04-03 MED ORDER — SODIUM CHLORIDE 0.9 % IV BOLUS
1000.0000 mL | Freq: Once | INTRAVENOUS | Status: AC
Start: 2014-04-03 — End: 2014-04-03
  Administered 2014-04-03: 1000 mL via INTRAVENOUS

## 2014-04-03 MED ORDER — OXYCODONE-ACETAMINOPHEN 5-325 MG PO TABS
ORAL_TABLET | ORAL | Status: DC
Start: 2014-04-03 — End: 2017-06-01

## 2014-04-03 NOTE — ED Notes (Signed)
Patient c/o bilateral lower abd pain x 48 hours. Pt reports n/d, denies vomiting. Last BM approx 2 hours ago. Denies urinary complaints/flank pain. BP 126/68 mmHg  Pulse 83  Temp(Src) 97.7 F (36.5 C) (Oral)  Resp 16  Ht 6\' 4"  (1.93 m)  Wt 86.183 kg  BMI 23.14 kg/m2  SpO2 98%

## 2014-04-03 NOTE — ED Provider Notes (Signed)
EMERGENCY DEPARTMENT HISTORY AND PHYSICAL EXAM     Physician/Midlevel provider first contact with patient: 04/03/14 1625         Date: 04/03/2014  Patient Name: Richard Coleman    History of Presenting Illness     Chief Complaint   Patient presents with   . Abdominal Pain       History Provided By: Patient    Chief Complaint: Lower abdominal pain  Onset: 2 days ago  Timing: worsening  Location: lower abdomen  Quality: sharp/cramping  Severity: 8-9/10  Modifying Factors: none   Associated Symptoms: diarrhea    Additional History: Richard Coleman is a 29 y.o. male c/o lpwer abd pain starting yest when woke up. Constant. Before was dull, now is kind of sharp. Hurts with movement and pressure. Was admitted for similar pain on new years, had negative exploratory lap, felt to be crohns, but  Hasn't had colonoscopy yet. Has nausea, no vomiting. No fever. Loss of appetite. No hematuria. NKDA. No other med probs.    PCP: Pcp, Noneorunknown, MD      No current facility-administered medications for this encounter.     Current Outpatient Prescriptions   Medication Sig Dispense Refill   . docusate sodium (COLACE) 100 MG capsule Take 1 capsule (100 mg total) by mouth 2 (two) times daily. Stop if having loose stools  0   . oxyCODONE-acetaminophen (PERCOCET) 5-325 MG per tablet 1-2 tablets by mouth every 4-6 hours as needed for pain;  Do not drive or operate machinery while taking this medicine 10 tablet 0       Past History     Past Medical History:  Past Medical History   Diagnosis Date   . Crohn disease        Past Surgical History:  Past Surgical History   Procedure Laterality Date   . Treatment fistula anal     . Laparoscopy, diagnostic N/A 02/13/2014     Procedure: LAPAROSCOPY, DIAGNOSTIC;  Surgeon: Delena Serve, MD;  Location: [facility];  Service: General;  Laterality: N/A;   . Repair, umbilical hernia N/A 02/13/2014     Procedure: REPAIR, UMBILICAL HERNIA;  Surgeon: Delena Serve, MD;  Location: [facility];   Service: General;  Laterality: N/A;       Family History:  History reviewed. No pertinent family history.    Social History:  History   Substance Use Topics   . Smoking status: Never Smoker    . Smokeless tobacco: Not on file   . Alcohol Use: Yes      Comment: 6-8oz Whiskey daily X~1 year       Allergies:  No Known Allergies    Review of Systems   .Review of Systems   Constitutional: Positive for chills. Negative for fever.   Eyes: Negative for pain.   Respiratory: Negative for cough.    Cardiovascular: Negative for palpitations.   Gastrointestinal: Positive for nausea, abdominal pain and diarrhea. Negative for vomiting and blood in stool.   Genitourinary: Negative for dysuria.   Musculoskeletal: Negative for myalgias.   Skin: Negative for rash.   Neurological: Negative for dizziness and headaches.   Endo/Heme/Allergies: Does not bruise/bleed easily.   Psychiatric/Behavioral: Negative for depression.       Physical Exam   BP 125/72 mmHg  Pulse 94  Temp(Src) 97.7 F (36.5 C) (Oral)  Resp 18  Ht 6\' 4"  (1.93 m)  Wt 86.183 kg  BMI 23.14 kg/m2  SpO2 99%  Physical Exam   Constitutional: He is oriented to person, place, and time. He appears well-developed and well-nourished.   HENT:   Head: Normocephalic and atraumatic.   Eyes: EOM are normal. Pupils are equal, round, and reactive to light.   Neck: Normal range of motion. Neck supple.   Cardiovascular: Normal rate, regular rhythm and normal heart sounds.    Pulmonary/Chest: Effort normal and breath sounds normal.   Abdominal: Soft. There is tenderness (RLQ and suprapubic).   Umbilical hernia scar c/d/i.   Musculoskeletal: Normal range of motion.   Neurological: He is alert and oriented to person, place, and time.   Skin: Skin is warm and dry.   Psychiatric: He has a normal mood and affect. His behavior is normal. Judgment and thought content normal.   Nursing note and vitals reviewed.        Diagnostic Study Results     Labs -     Results     Procedure Component  Value Units Date/Time    UA, Reflex to Microscopic [[id number]]  (Abnormal) Collected:  04/03/14 1651    Specimen Information:  Urine Updated:  04/03/14 1747     Urine Type Clean Catch      Color, UA Yellow      Clarity, UA Clear      Specific Gravity UA 1.018      Urine pH 6.0      Leukocyte Esterase, UA Trace (A)      Nitrite, UA Negative      Protein, UR Negative      Glucose, UA Negative      Ketones UA Negative      Urobilinogen, UA Negative mg/dL      Bilirubin, UA Negative      Blood, UA Negative      RBC, UA 0 - 5 /hpf      WBC, UA 0 - 5 /hpf     UA POC [[id number]]  (Abnormal) Collected:  04/03/14 1743     Color UA POCT Yellow Updated:  04/03/14 1744     Clarity UA POCT Clear      Glucose, UA POCT Negative      Bilirubin, UA POCT Negative      Ketones, UA POCT Negative mg/dL      Specific Gravity, UA POCT 1.020 mg/dL      Blood, UA POCT  Negative      PH, UA POCT 6.5      Protein, UA POCT Negative mg/dL      Urobilinogen, UA POCT 0.2 mg/dL      Nitrite, UA POCT Negative      Leukocytes, UA POCT Trace  (A)     Comprehensive metabolic panel [[id number]]  (Abnormal) Collected:  04/03/14 1642    Specimen Information:  Blood Updated:  04/03/14 1722     Glucose 113 (H) mg/dL      BUN 08.6 mg/dL      Creatinine 0.9 mg/dL      Sodium 578 mEq/L      Potassium 4.8 mEq/L      Chloride 104 mEq/L      CO2 26 mEq/L      CALCIUM 9.1 mg/dL      Protein, Total 6.9 g/dL      Albumin 3.6 g/dL      AST (SGOT) 22 U/L      ALT 25 U/L      Alkaline Phosphatase 51 U/L      Bilirubin, Total 0.6 mg/dL  Globulin 3.3 g/dL      Albumin/Globulin Ratio 1.1      Anion Gap 9.0     Lipase [[id number]] Collected:  04/03/14 1642    Specimen Information:  Blood Updated:  04/03/14 1722     Lipase 40 U/L     Hemolysis index [[id number]]  (Abnormal) Collected:  04/03/14 1642     Hemolysis Index 124 (H) Updated:  04/03/14 1722    GFR [[id number]] Collected:  04/03/14 1642     EGFR >60.0 Updated:  04/03/14 1722    CBC with differential [[id number]]   (Abnormal) Collected:  04/03/14 1643    Specimen Information:  Blood / Blood Updated:  04/03/14 1653     WBC 9.48 x10 3/uL      Hgb 13.7 g/dL      Hematocrit 62.9 (L) %      Platelets 277 x10 3/uL      RBC 4.56 (L) x10 6/uL      MCV 84.9 fL      MCH 30.0 pg      MCHC 35.4 g/dL      RDW 14 %      MPV 9.8 fL      Neutrophils 77 %      Lymphocytes Automated 11 %      Monocytes 8 %      Eosinophils Automated 3 %      Basophils Automated 0 %      Immature Granulocyte 0 %      Nucleated RBC 0 /100 WBC      Neutrophils Absolute 7.34 x10 3/uL      Abs Lymph Automated 1.03 x10 3/uL      Abs Mono Automated 0.78 x10 3/uL      Abs Eos Automated 0.32 x10 3/uL      Absolute Baso Automated 0.01 x10 3/uL      Absolute Immature Granulocyte 0.02 x10 3/uL           Radiologic Studies -   Radiology Results (24 Hour)     Procedure Component Value Units Date/Time    CT Abd/Pelvis with IV Contrast only [[id number]] Collected:  04/03/14 2055    Order Status:  Completed Updated:  04/03/14 2109    Narrative:      CT ABDOMEN AND PELVIS WITH CONTRAST    CLINICAL STATEMENT: Right lower quadrant pain.     COMPARISON: No prior studies are available for comparison.    TECHNIQUE: Helical axial imaging from above the domes of diaphragm  through the pubic symphysis following administration of 100 cc of  Omnipaque 350  intravenously and without  oral contrast. Sagittal and  coronal reconstructions were obtained.     FINDINGS: The liver, gallbladder, spleen, adrenal glands, pancreas and  kidneys are unremarkable. There is no hydronephrosis.    No abdominal or pelvic lymphadenopathy is seen.     The study is performed without oral contrast therefore evaluation of the  hollow viscus and the intra-abdominal structures is limited.. The large  and small bowel are normal in caliber.     Inflammatory changes are identified throughout the right mid and right  lower abdominal peritoneal fat with stranding adjacent to the right mid  abdominal bowel loops.  Inflammation appears to be primarily associated  with the terminal ileum which is mildly prominent. The appendix cannot  be visualized.    Trace free fluid is present within this region and there are scattered  shotty reactive mesenteric lymph nodes in this area.  No extraluminal  discrete rim-enhancing collections can be identified in this region.  There is no free intraperitoneal air. A similar appearance was noted on  the examination of 02/13/2014.    Trace free fluid is present within the pelvis and right hemiabdomen.    The osseous structures are intact.      Impression:        Since 02/13/2014, unchanged inflammatory changes throughout the right  mid and lower abdomen primarily associated with the terminal ileum.  Findings are most consistent with severe ileitis although appendicitis  cannot be entirely excluded. Please correlate with the patients clinical  history.    Fonnie Mu, MD   04/03/2014 9:05 PM        .      Medical Decision Making   I am the first provider for this patient.    I reviewed the vital signs, available nursing notes, past medical history, past surgical history, family history and social history.    Vital Signs-Reviewed the patient's vital signs.     Patient Vitals for the past 12 hrs:   BP Temp Pulse Resp   04/03/14 2201 125/72 mmHg - 94 18   04/03/14 2041 121/69 mmHg - 86 20   04/03/14 1737 117/78 mmHg - 84 18   04/03/14 1541 126/68 mmHg 97.7 F (36.5 C) 83 16       Pulse Oximetry Analysis - Normal 99% on RA    Cardiac Monitor:  Rate: 83  Rhythm:  Normal Sinus Rhythm        Old Medical Records: Old medical records.  Nursing notes.     ED Course:  5:30 PM Pt states that pain has improved.    8:45 PM Rudie Meyer, PA for Dr. Maple Hudson (GI) agrees with plan, will see him in the office tomorrow.    9:00 PM  Pt is feeling better and would like to go home.  Discussed test results with pt and counseled on diagnosis, f/u plans, medication use, and signs and symptoms when to return to ED.  Pt is  stable and ready for discharge.      Provider Notes: 29 yo male with recurrence of abdominal pain.  No wbc count, CT shows terminal ilietis.  No definite appy.  Without wbc count, benign abdominal exam, afebrile, doubt appy at this point. More consistent with crohns.  Will d/c with close Gi follow-up.    Diagnosis     Clinical Impression:   1. Terminal ileitis, without complications        _______________________________    Attestations:  This note is prepared by Juan Quam acting as Scribe for Caroline Sauger, MD.    Caroline Sauger, MD. The scribe's documentation has been prepared under my direction and personally reviewed by me in its entirety.  I confirm that the note above accurately reflects all work, treatment, procedures, and medical decision making performed by me.    _______________________________          Ashley Jacobs, MD  04/03/14 [phone]

## 2014-04-03 NOTE — ED Notes (Signed)
HIV Rapid Test 04/03/2014: HIV Negative

## 2014-04-03 NOTE — Discharge Instructions (Signed)
Call [phone] or [phone] in the morning to arrange a follow-up with your GI      Crohns Disease Exacerbation    You have been seen for Crohn s Disease.    Crohn s Disease is an inflammatory disease. It affects the gastrointestinal tract. This includes the esophagus, stomach, small intestine and large intestine. The disease can affect any part of the system from the mouth to the anus.    Crohn s Disease cannot be cured. Treatment can reduce the inflammation and help with your symptoms.   Take the anti-diarrhea medicine as directed.    It is VERY IMPORTANT that you follow up with your gastroenterologist as soon as you can.    YOU SHOULD SEEK MEDICAL ATTENTION IMMEDIATELY, EITHER HERE OR AT THE NEAREST EMERGENCY DEPARTMENT, IF ANY OF THE FOLLOWING OCCURS:   You have pain that does not go away or gets worse.    You vomit and cannot keep down any food or fluids.   You have diarrhea that gets worse or does not go away.   You run a fever (temperature higher than 100.48F / 38C).

## 2018-04-10 ENCOUNTER — Encounter (HOSPITAL_COMMUNITY): Payer: Self-pay

## 2018-04-10 ENCOUNTER — Emergency Department (HOSPITAL_COMMUNITY)
Admission: EM | Admit: 2018-04-10 | Discharge: 2018-04-10 | Disposition: A | Discharge status: Home / Self Care | Payer: Self-pay | Attending: Emergency Medicine | Admitting: Emergency Medicine

## 2018-04-10 ENCOUNTER — Emergency Department (HOSPITAL_COMMUNITY): Payer: Self-pay

## 2018-04-10 DIAGNOSIS — R0781 Pleurodynia: Secondary | ICD-10-CM | POA: Insufficient documentation

## 2018-04-10 DIAGNOSIS — J189 Pneumonia, unspecified organism: Secondary | ICD-10-CM | POA: Insufficient documentation

## 2018-04-10 DIAGNOSIS — R911 Solitary pulmonary nodule: Secondary | ICD-10-CM | POA: Insufficient documentation

## 2018-04-10 DIAGNOSIS — J181 Lobar pneumonia, unspecified organism: Secondary | ICD-10-CM

## 2018-04-10 LAB — BASIC METABOLIC PANEL
Anion gap: 6 (ref 5–15)
BUN: 11 mg/dL (ref 6–20)
CO2: 29 mmol/L (ref 22–32)
CREATININE: 0.88 mg/dL (ref 0.61–1.24)
Calcium: 9 mg/dL (ref 8.9–10.3)
Chloride: 104 mmol/L (ref 98–111)
GFR calc Af Amer: >60 mL/min (ref >60)
Glucose, Bld: 109 mg/dL — ABNORMAL HIGH (ref 70–99)
Potassium: 3.9 mmol/L (ref 3.5–5.1)
Sodium: 139 mmol/L (ref 135–145)

## 2018-04-10 LAB — CBC
HCT: 41.8 % (ref 39.0–52.0)
Hemoglobin: 14.5 g/dL (ref 13.0–17.0)
MCH: 31 pg (ref 26.0–34.0)
MCHC: 34.7 g/dL (ref 30.0–36.0)
MCV: 89.5 fL (ref 80.0–100.0)
Platelets: 279 10*3/uL (ref 150–400)
RBC: 4.67 MIL/uL (ref 4.22–5.81)
RDW: 14.6 % (ref 11.5–15.5)
WBC: 8.7 10*3/uL (ref 4.0–10.5)
nRBC: 0 % (ref 0.0–0.2)

## 2018-04-10 LAB — POCT I-STAT TROPONIN I: Troponin i, poc: 0 ng/mL (ref 0.00–0.08)

## 2018-04-10 MED ORDER — SODIUM CHLORIDE 0.9% FLUSH: 3.0000 mL | Freq: Once | INTRAVENOUS | Status: DC

## 2018-04-10 MED ORDER — HYDROCODONE-ACETAMINOPHEN 5-325 MG PO TABS
1.0000 | ORAL_TABLET | Freq: Once | ORAL | Status: AC
  Administered 2018-04-10: 1 via ORAL
  Filled 2018-04-10: qty 1

## 2018-04-10 MED ORDER — SODIUM CHLORIDE (PF) 0.9 % IJ SOLN
INTRAMUSCULAR | Status: AC
  Filled 2018-04-10: qty 50

## 2018-04-10 MED ORDER — IOPAMIDOL (ISOVUE-370) INJECTION 76%
100.0000 mL | Freq: Once | INTRAVENOUS | Status: AC | PRN
  Administered 2018-04-10: 100 mL via INTRAVENOUS

## 2018-04-10 MED ORDER — DOXYCYCLINE HYCLATE 100 MG PO TABS
100.0000 mg | ORAL_TABLET | Freq: Once | ORAL | Status: AC
  Administered 2018-04-10: 100 mg via ORAL
  Filled 2018-04-10: qty 1

## 2018-04-10 MED ORDER — HYDROCODONE-ACETAMINOPHEN 5-325 MG PO TABS: 1.0000 | ORAL_TABLET | Freq: Four times a day (QID) | ORAL | 0 refills | Status: AC | PRN

## 2018-04-10 MED ORDER — DOXYCYCLINE HYCLATE 100 MG PO CAPS: 100.0000 mg | ORAL_CAPSULE | Freq: Two times a day (BID) | ORAL | 0 refills | Status: AC

## 2018-04-10 MED ORDER — KETOROLAC TROMETHAMINE 30 MG/ML IJ SOLN
30.0000 mg | Freq: Once | INTRAMUSCULAR | Status: AC
  Administered 2018-04-10: 30 mg via INTRAVENOUS
  Filled 2018-04-10: qty 1

## 2018-04-10 MED ORDER — IOPAMIDOL (ISOVUE-370) INJECTION 76%
INTRAVENOUS | Status: AC
  Filled 2018-04-10: qty 100

## 2018-04-10 NOTE — Discharge Instructions (Signed)
Take antibiotics twice daily as directed.  You may use ibuprofen and Tylenol for pain, Norco as needed for breakthrough pain.  Please use incentive spirometer to make sure you are taking good deep breaths.  If symptoms are not improving you have worsening pain, fevers, shortness of breath please return to the emergency department otherwise you will need to follow-up with your primary care doctor, you will need a repeat chest x-ray after antibiotics to ensure your pneumonia has resolved.  You can use the phone number on your paperwork today to establish care with a new primary care doctor if you do not have one.

## 2018-04-10 NOTE — ED Provider Notes (Signed)
[facility]-EMERGENCY DEPT Provider Note   CSN: [id number] Arrival date & time: 04/10/18  0848    History   Chief Complaint Chief Complaint  Patient presents with  . Chest Pain    HPI [name] is a 33 y.o. male.     [name] is a 33 y.o. male with a history of Crohn's disease, who presents to the emergency department for evaluation of right-sided chest pain. Patient reports pain started 2 nights ago it was initially slight pains but has since become significantly worse.  Patient reports pain is most severe when he takes a deep breath in.  He describes the pain as a sharp stabbing sensation over the right side of his chest that radiates towards the middle.  Pain is not worse with exertion.  Pain is nonradiating.  No pain in his back.  He does report that he is having intermittent productive cough has had some chills but no measured fever.  He is also had some sneezing and nasal congestion, coughing makes pain worse as well.  He denies any associated lightheadedness or syncope.  No lower extremity swelling or pain.  No diaphoresis, nausea or vomiting.  No abdominal pain.  He has not taken anything to treat the pain prior to arrival, no other aggravating or alleviating factors.  No known sick contacts.     Past Medical History:  Diagnosis Date  . Crohn's disease (HCC)   . Rectal abscess   . Rectal fistula   . Rectal pain     Patient Active Problem List   Diagnosis Date Noted  . Peri-rectal abscess 02/29/2012  . 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 CROHN'S DISEASE-LARGE & SMALL INTESTINE 01/30/2009  . RECTAL BLEEDING 11/21/2007    Past Surgical History:  Procedure Laterality Date  . drainage  peri anal abscess          Home Medications    Prior to Admission medications   Medication Sig Start Date End Date Taking? Authorizing Provider  acetaminophen (TYLENOL) 500 MG tablet Take 2,000 mg by mouth every 6 (six) hours as needed for moderate pain.    Yes [provider]  doxycycline (VIBRAMYCIN) 100 MG capsule Take 1 capsule (100 mg total) by mouth 2 (two) times daily. One po bid x 7 days 04/10/18   Dartha Lodge, PA-C  HYDROcodone-acetaminophen [facility]) 5-325 MG tablet Take 1 tablet by mouth every 6 (six) hours as needed. 04/10/18   Dartha Lodge, PA-C    Family History History reviewed. No pertinent family history.  Social History Social History   Tobacco Use  . Smoking status: Never Smoker  . Smokeless tobacco: Never Used  Substance Use Topics  . Alcohol use: Yes    Comment: daily - multiple  . Drug use: No     Allergies   Penicillins   Review of Systems Review of Systems  Constitutional: Positive for chills. Negative for fever.  HENT: Negative for congestion, rhinorrhea and sore throat.   Eyes: Negative for visual disturbance.  Respiratory: Positive for cough, chest tightness and shortness of breath. Negative for wheezing.   Cardiovascular: Positive for chest pain. Negative for palpitations and leg swelling.  Gastrointestinal: Negative for abdominal pain, nausea and vomiting.  Genitourinary: Negative for dysuria, flank pain and frequency.  Musculoskeletal: Negative for arthralgias and myalgias.  Skin: Negative for color change and rash.  Neurological: Negative for dizziness, syncope and light-headedness.     Physical Exam Updated Vital Signs BP 117/81  Pulse 69   Temp 97.8 F (36.6 C) (Oral)   Resp 16   SpO2 97%   Physical Exam Vitals signs and nursing note reviewed.  Constitutional:      General: He is not in acute distress.    Appearance: He is well-developed and normal weight. He is not ill-appearing or diaphoretic.  HENT:     Head: Normocephalic and atraumatic.  Eyes:     General:        Right eye: No discharge.        Left eye: No discharge.  Neck:     Musculoskeletal: Neck supple.     Trachea: No tracheal deviation.  Cardiovascular:     Rate and Rhythm: Normal rate and regular  rhythm.     Pulses:          Radial pulses are 2+ on the right side and 2+ on the left side.       Dorsalis pedis pulses are 2+ on the right side and 2+ on the left side.       Posterior tibial pulses are 2+ on the right side and 2+ on the left side.     Heart sounds: Normal heart sounds. No murmur. No friction rub. No gallop.   Pulmonary:     Effort: Pulmonary effort is normal. No respiratory distress.     Breath sounds: Normal breath sounds. No wheezing or rales.     Comments: Respirations equal and unlabored, patient able to speak in full sentences, lungs clear to auscultation bilaterally, breath sounds slightly diminished on the right Chest:     Chest wall: Tenderness present.    Abdominal:     General: Bowel sounds are normal. There is no distension.     Palpations: Abdomen is soft. There is no mass.     Tenderness: There is no abdominal tenderness. There is no guarding.     Comments: Abdomen soft, nondistended, nontender to palpation in all quadrants without guarding or peritoneal signs  Musculoskeletal:        General: No deformity.  Skin:    General: Skin is warm and dry.     Capillary Refill: Capillary refill takes less than 2 seconds.  Neurological:     Mental Status: He is alert and oriented to person, place, and time.     Coordination: Coordination normal.     Comments: Speech is clear, able to follow commands Moves extremities without ataxia, coordination intact  Psychiatric:        Mood and Affect: Mood normal.        Behavior: Behavior normal.      ED Treatments / Results  Labs (all labs ordered are listed, but only abnormal results are displayed) Labs Reviewed  BASIC METABOLIC PANEL - Abnormal; Notable for the following components:      Result Value   Glucose, Bld 109 (*)    All other components within normal limits  CBC  I-STAT TROPONIN, ED  POCT I-STAT TROPONIN I    EKG EKG Interpretation  Date/Time:  Tuesday April 10 2018 08:56:29  EST Ventricular Rate:  86 PR Interval:    QRS Duration: 109 QT Interval:  355 QTC Calculation: 425 R Axis:   85 Text Interpretation:  Sinus rhythm RSR' in V1 or V2, probably normal variant Confirmed by Benjiman CorePickering, Nathan [phone]([id number]) on 04/10/2018 11:52:57 AM   Radiology Dg Chest 2 View  Result Date: 04/10/2018 CLINICAL DATA:  Chest pain. EXAM: CHEST - 2 VIEW COMPARISON:  Radiographs of  January 30, 2009. FINDINGS: The heart size and mediastinal contours are within normal limits. No pneumothorax or pleural effusion is noted. Left lung is clear. Ill-defined wedge-shaped airspace opacity is noted peripherally in the right upper lobe; given the history of chest pain, potentially this may represent Hampton's hump sign suggesting possible pulmonary embolus. Alternatively this may simply represent focal pneumonia. The visualized skeletal structures are unremarkable. IMPRESSION: Ill-defined wedge-shaped low density is noted laterally in the right upper lobe which potentially may represent Hampton's hump sign and possible pulmonary embolus. Alternatively, it may simply represent peripheral pneumonia or inflammation. Clinical correlation is recommended and CTA of the chest may be performed for further evaluation. Electronically Signed   By: Lupita Raider, M.D.   On: 04/10/2018 09:47   Ct Angio Chest Pe W And/or Wo Contrast  Result Date: 04/10/2018 CLINICAL DATA:  Rule out PE, abnormal chest radiograph EXAM: CT ANGIOGRAPHY CHEST WITH CONTRAST TECHNIQUE: Multidetector CT imaging of the chest was performed using the standard protocol during bolus administration of intravenous contrast. Multiplanar CT image reconstructions and MIPs were obtained to evaluate the vascular anatomy. CONTRAST:  ISOVUE-370 IOPAMIDOL (ISOVUE-370) INJECTION 76% COMPARISON:  Chest radiograph 04/10/2018 FINDINGS: Cardiovascular: Satisfactory opacification of the pulmonary arteries to the segmental level. No evidence of pulmonary  embolism. Normal heart size. No pericardial effusion. Mediastinum/Nodes: No enlarged mediastinal, hilar, or axillary lymph nodes. Thyroid gland, trachea, and esophagus demonstrate no significant findings. Lungs/Pleura: There is a dense, masslike opacity of the peripheral right upper lobe measuring 2.1 cm (series 4, image 25). Additional small nodule of the medial right apex measuring 9 mm (series 4, image 20). Small right pleural effusion and associated atelectasis or consolidation. Upper Abdomen: No acute abnormality. Musculoskeletal: No chest wall abnormality. No acute or significant osseous findings. Review of the MIP images confirms the above findings. IMPRESSION: 1.  Negative examination for pulmonary embolism. 2. There is a dense, masslike opacity of the peripheral right upper lobe measuring 2.1 cm (series 4, image 25). Additional small nodule of the medial right apex measuring 9 mm (series 4, image 20). Small right pleural effusion and associated atelectasis or consolidation. Finding likely reflect a somewhat unusual appearance of infection although underlying mass is not strictly excluded. Hematogenous infection is a consideration. At minimum, recommend follow-up examination in 6-8 weeks to ensure resolution. Electronically Signed   By: Lauralyn Primes M.D.   On: 04/10/2018 13:17    Procedures Procedures (including critical care time)  Medications Ordered in ED Medications  sodium chloride (PF) 0.9 % injection (has no administration in time range)  iopamidol (ISOVUE-370) 76 % injection (has no administration in time range)  ketorolac (TORADOL) 30 MG/ML injection 30 mg (30 mg Intravenous Given 04/10/18 1313)  iopamidol (ISOVUE-370) 76 % injection 100 mL (100 mLs Intravenous Contrast Given 04/10/18 1230)  HYDROcodone-acetaminophen (NORCO/VICODIN) 5-325 MG per tablet 1 tablet (1 tablet Oral Given 04/10/18 1426)  doxycycline (VIBRA-TABS) tablet 100 mg (100 mg Oral Given 04/10/18 1427)     Initial  Impression / Assessment and Plan / ED Course  I have reviewed the triage vital signs and the nursing notes.  Pertinent labs & imaging results that were available during my care of the patient were reviewed by me and considered in my medical decision making (see chart for details).  Patient presents to the emergency department for evaluation of severe sharp right-sided chest pains that started 2 nights ago.  They are pleuritic in nature and worse with cough and breathing.  Nonradiating, nonexertional,  lower suspicion for ACS.  Patient does report an associated cough and some chills but no fever.  Otherwise healthy with no cardiac risk factors.  Presentation concerning for pneumonia or PE, he has no clinical signs of DVT and no history of PE.  Initial cardiac work-up started from triage, EKG shows normal sinus rhythm with no concerning changes.  Negative troponin.  Chest x-ray shows ill-defined wedge-shaped density in the right upper lobewhich could represent a Hampton's hump, and possible PE versus peripheral pneumonia or inflammation.  Given this information will proceed directly to CTA rather than getting a d-dimer.  Pain medication given to control pleuritic chest pain.  Labs otherwise reassuring, no leukocytosis and normal hemoglobin, no acute electrolyte derangements and normal renal function.  CTA showed no evidence of PE, there is a dense masslike opacity in the peripheral right upper lobe measuring 2.1 cm and additional small nodule within the medial apex of the right lung measuring 9 mm, there is a small associated right pleural effusion and associated atelectasis or consolidation.  This could be an unusual appearing infection, although an underlying mass cannot be ruled out.  Patient will need follow-up x-ray after antibiotics to see if this has resolved.  Will treat with doxycycline, and a short course of Norco for pleuritic pain.  Patient provided with incentive spirometer to help encourage deep  breaths and prevent worsening of pneumonia.  Discussed importance of following up for repeat x-ray and strict return precautions.  Patient expresses understanding and agreement with plan.  Discharged home in good condition.  Final Clinical Impressions(s) / ED Diagnoses   Final diagnoses:  Community acquired pneumonia of right upper lobe of lung (HCC)  Pleuritic chest pain    ED Discharge Orders         Ordered    doxycycline (VIBRAMYCIN) 100 MG capsule  2 times daily     04/10/18 1438    HYDROcodone-acetaminophen (NORCO) 5-325 MG tablet  Every 6 hours PRN     04/10/18 1438           Legrand Rams 04/15/18 1832    Benjiman Core, MD 04/18/18 [phone]

## 2018-04-10 NOTE — ED Triage Notes (Signed)
Patient c/o chest pain that started 2 nights ago. Started a slight pains in chest, back, and side,  Over the past 24 hours chest pain has become more severe that will not go away.   Pain radiates from right chest area to right upper back.   Patient states we he coughs or sneezes it is 10/10.   Sitting in triage 9/10 pain.   Denies strenuous activity.  Denies n/v diarrhea or abdominal pain.   A/Ox4 Ambulatory in triage.  Drove himself here to ED.

## 2018-12-15 ENCOUNTER — Emergency Department (HOSPITAL_COMMUNITY)
Admission: EM | Admit: 2018-12-15 | Discharge: 2018-12-15 | Disposition: A | Discharge status: Home / Self Care | Payer: Self-pay | Attending: Emergency Medicine | Admitting: Emergency Medicine

## 2018-12-15 ENCOUNTER — Emergency Department (HOSPITAL_COMMUNITY): Payer: Self-pay

## 2018-12-15 ENCOUNTER — Other Ambulatory Visit: Payer: Self-pay

## 2018-12-15 DIAGNOSIS — R61 Generalized hyperhidrosis: Secondary | ICD-10-CM | POA: Insufficient documentation

## 2018-12-15 DIAGNOSIS — R079 Chest pain, unspecified: Secondary | ICD-10-CM | POA: Insufficient documentation

## 2018-12-15 LAB — BASIC METABOLIC PANEL
Anion gap: 10 (ref 5–15)
BUN: 17 mg/dL (ref 6–20)
CO2: 25 mmol/L (ref 22–32)
Calcium: 9.7 mg/dL (ref 8.9–10.3)
Chloride: 104 mmol/L (ref 98–111)
Creatinine, Ser: 0.93 mg/dL (ref 0.61–1.24)
GFR calc Af Amer: >60 mL/min (ref >60)
GFR calc non Af Amer: >60 mL/min (ref >60)
Glucose, Bld: 116 mg/dL — ABNORMAL HIGH (ref 70–99)
Potassium: 4.1 mmol/L (ref 3.5–5.1)
Sodium: 139 mmol/L (ref 135–145)

## 2018-12-15 LAB — CBC
HCT: 41.7 % (ref 39.0–52.0)
Hemoglobin: 15.1 g/dL (ref 13.0–17.0)
MCH: 31.8 pg (ref 26.0–34.0)
MCHC: 36.2 g/dL — ABNORMAL HIGH (ref 30.0–36.0)
MCV: 87.8 fL (ref 80.0–100.0)
Platelets: 280 10*3/uL (ref 150–400)
RBC: 4.75 MIL/uL (ref 4.22–5.81)
RDW: 13.1 % (ref 11.5–15.5)
WBC: 5.5 10*3/uL (ref 4.0–10.5)
nRBC: 0 % (ref 0.0–0.2)

## 2018-12-15 LAB — TROPONIN I (HIGH SENSITIVITY)
Troponin I (High Sensitivity): <2 ng/L (ref <18)
Troponin I (High Sensitivity): <2 ng/L (ref <18)

## 2018-12-15 NOTE — Discharge Instructions (Addendum)
You were seen in the emergency department for left-sided chest pressure.  You had blood work EKG and a chest x-ray that did not show any obvious explanation for your symptoms.  It would be important for you to contact your primary care doctor for close follow-up and please return to the emergency department if any worsening or concerning symptoms

## 2018-12-15 NOTE — ED Provider Notes (Signed)
[facility] DEPT Provider Note   CSN: [id number] Arrival date & time: 12/15/18  1246     History   Chief Complaint Chief Complaint  Patient presents with  . Chest Pain    HPI [name] is a 33 y.o. male.  He has a history of Crohn's disease.  He is here with a complaint of of chest pressure that began around 11:00 today.  He says it was not really pain just this pressure uncomfortable feeling.  He says he was a little clammy when it happened.  He went and laid down and it seemed to let up but now it is very mild.  Is never had this before.  Some radiation to the arm.  HPI: A 33 year old patient presents for evaluation of chest pain. Initial onset of pain was approximately 1-3 hours ago. The patient's chest pain is described as heaviness/pressure/tightness and is not worse with exertion. The patient reports some diaphoresis. The patient's chest pain is middle- or left-sided, is not well-localized, is not sharp and does radiate to the arms/jaw/neck. The patient does not complain of nausea. The patient has no history of stroke, has no history of peripheral artery disease, has not smoked in the past 90 days, denies any history of treated diabetes, has no relevant family history of coronary artery disease (first degree relative at less than age 72), is not hypertensive, has no history of hypercholesterolemia and does not have an elevated BMI (>=30).   HPI  Past Medical History:  Diagnosis Date  . Crohn's disease ([place])   . Rectal abscess   . Rectal fistula   . Rectal pain     Patient Active Problem List   Diagnosis Date Noted  . Peri-rectal abscess 02/29/2012  . 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 [place] INTESTINE 01/30/2009  . RECTAL BLEEDING 11/21/2007    Past Surgical History:  Procedure Laterality Date  . drainage  peri anal abscess          Home Medications    Prior to Admission medications   Medication Sig Start Date  End Date Taking? Authorizing Provider  acetaminophen (TYLENOL) 500 MG tablet Take 2,000 mg by mouth every 6 (six) hours as needed for moderate pain.    [provider]  doxycycline (VIBRAMYCIN) 100 MG capsule Take 1 capsule (100 mg total) by mouth 2 (two) times daily. One po bid x 7 days 04/10/18   Jacqlyn Larsen, PA-C  HYDROcodone-acetaminophen [facility]) 5-325 MG tablet Take 1 tablet by mouth every 6 (six) hours as needed. 04/10/18   Jacqlyn Larsen, PA-C    Family History No family history on file.  Social History Social History   Tobacco Use  . Smoking status: Never Smoker  . Smokeless tobacco: Never Used  Substance Use Topics  . Alcohol use: Yes    Comment: daily - multiple  . Drug use: No     Allergies   Penicillins   Review of Systems Review of Systems  Constitutional: Negative for fever.  HENT: Negative for sore throat.   Eyes: Negative for visual disturbance.  Respiratory: Negative for shortness of breath.   Cardiovascular: Positive for chest pain.  Gastrointestinal: Negative for abdominal pain.  Genitourinary: Negative for dysuria.  Musculoskeletal: Negative for gait problem.  Skin: Negative for rash.  Neurological: Negative for headaches.     Physical Exam Updated Vital Signs BP (!) 136/92 (BP Location: Left Arm)   Pulse 66   Temp 98 F (36.7  C) (Oral)   Resp 15   Ht 6\' 4"  (1.93 m)   Wt 90.7 kg   SpO2 99%   BMI 24.34 kg/m   Physical Exam Vitals signs and nursing note reviewed.  Constitutional:      Appearance: He is well-developed.  HENT:     Head: Normocephalic and atraumatic.  Eyes:     Conjunctiva/sclera: Conjunctivae normal.  Neck:     Musculoskeletal: Neck supple.  Cardiovascular:     Rate and Rhythm: Normal rate and regular rhythm.     Pulses: Normal pulses.     Heart sounds: No murmur.  Pulmonary:     Effort: Pulmonary effort is normal. No respiratory distress.     Breath sounds: Normal breath sounds.  Abdominal:      Palpations: Abdomen is soft.     Tenderness: There is no abdominal tenderness.  Musculoskeletal: Normal range of motion.     Right lower leg: He exhibits no tenderness.     Left lower leg: He exhibits no tenderness.  Skin:    General: Skin is warm and dry.     Capillary Refill: Capillary refill takes less than 2 seconds.  Neurological:     General: No focal deficit present.     Mental Status: He is alert.      ED Treatments / Results  Labs (all labs ordered are listed, but only abnormal results are displayed) Labs Reviewed  BASIC METABOLIC PANEL - Abnormal; Notable for the following components:      Result Value   Glucose, Bld 116 (*)    All other components within normal limits  CBC - Abnormal; Notable for the following components:   MCHC 36.2 (*)    All other components within normal limits  TROPONIN I (HIGH SENSITIVITY)  TROPONIN I (HIGH SENSITIVITY)    EKG EKG Interpretation  Date/Time:  Saturday December 15 2018 12:53:47 EDT Ventricular Rate:  73 PR Interval:    QRS Duration: 112 QT Interval:  387 QTC Calculation: 427 R Axis:   87 Text Interpretation: Sinus rhythm Borderline intraventricular conduction delay RSR' in V1 or V2, probably normal variant similar to prior 2/20 Confirmed by Meridee ScoreButler, Temeka Pore [phone]([id number]) on 12/15/2018 4:43:31 PM   Radiology Dg Chest 2 View  Result Date: 12/15/2018 CLINICAL DATA:  Chest pain EXAM: CHEST - 2 VIEW COMPARISON:  04/10/2018 FINDINGS: The heart size and mediastinal contours are within normal limits. Both lungs are clear. The visualized skeletal structures are unremarkable. IMPRESSION: No acute abnormality of the lungs. Electronically Signed   By: Lauralyn PrimesAlex  Bibbey M.D.   On: 12/15/2018 13:13    Procedures Procedures (including critical care time)  Medications Ordered in ED Medications - No data to display   Initial Impression / Assessment and Plan / ED Course  I have reviewed the triage vital signs and the nursing notes.   Pertinent labs & imaging results that were available during my care of the patient were reviewed by me and considered in my medical decision making (see chart for details).  Clinical Course as of Dec 16 935  Sat Dec 15, 2018  [id number] 33 year old male here with left-sided chest pressure.  Differential includes musculoskeletal, pneumothorax, ACS, GERD, vascular.  His PERC score is negative and his hear score is 2.  Getting second troponin.   [MB]  1807 Delta troponin negative.  I reviewed all this with him and he is comfortable going home and will follow up with his PCP.  We talked about indications for return.   [  MB]    Clinical Course User Index [MB] Terrilee Files, MD    HEAR Score: 2   Final Clinical Impressions(s) / ED Diagnoses   Final diagnoses:  Nonspecific chest pain    ED Discharge Orders    None       Terrilee Files, MD 12/16/18 [phone]

## 2018-12-15 NOTE — ED Triage Notes (Signed)
Patient reports he was sitting at table and chest began hurting on right side/shoulder area. Patient states it feels tight and feels like something is sitting on his chest. Pain rated 5/10. Pain started 1.5 hours ago.

## 2018-12-15 NOTE — ED Notes (Signed)
Patient walked to room 10 from ED lobby with a steady gate.

## 2019-06-23 IMAGING — CR DG CHEST 2V
2 series · 2 of 2 positions shown · non-contrast
Comparison: Radiographs January 30, 2009.

CLINICAL DATA: Chest pain.

EXAM:
CHEST - 2 VIEW

[w chest pa]
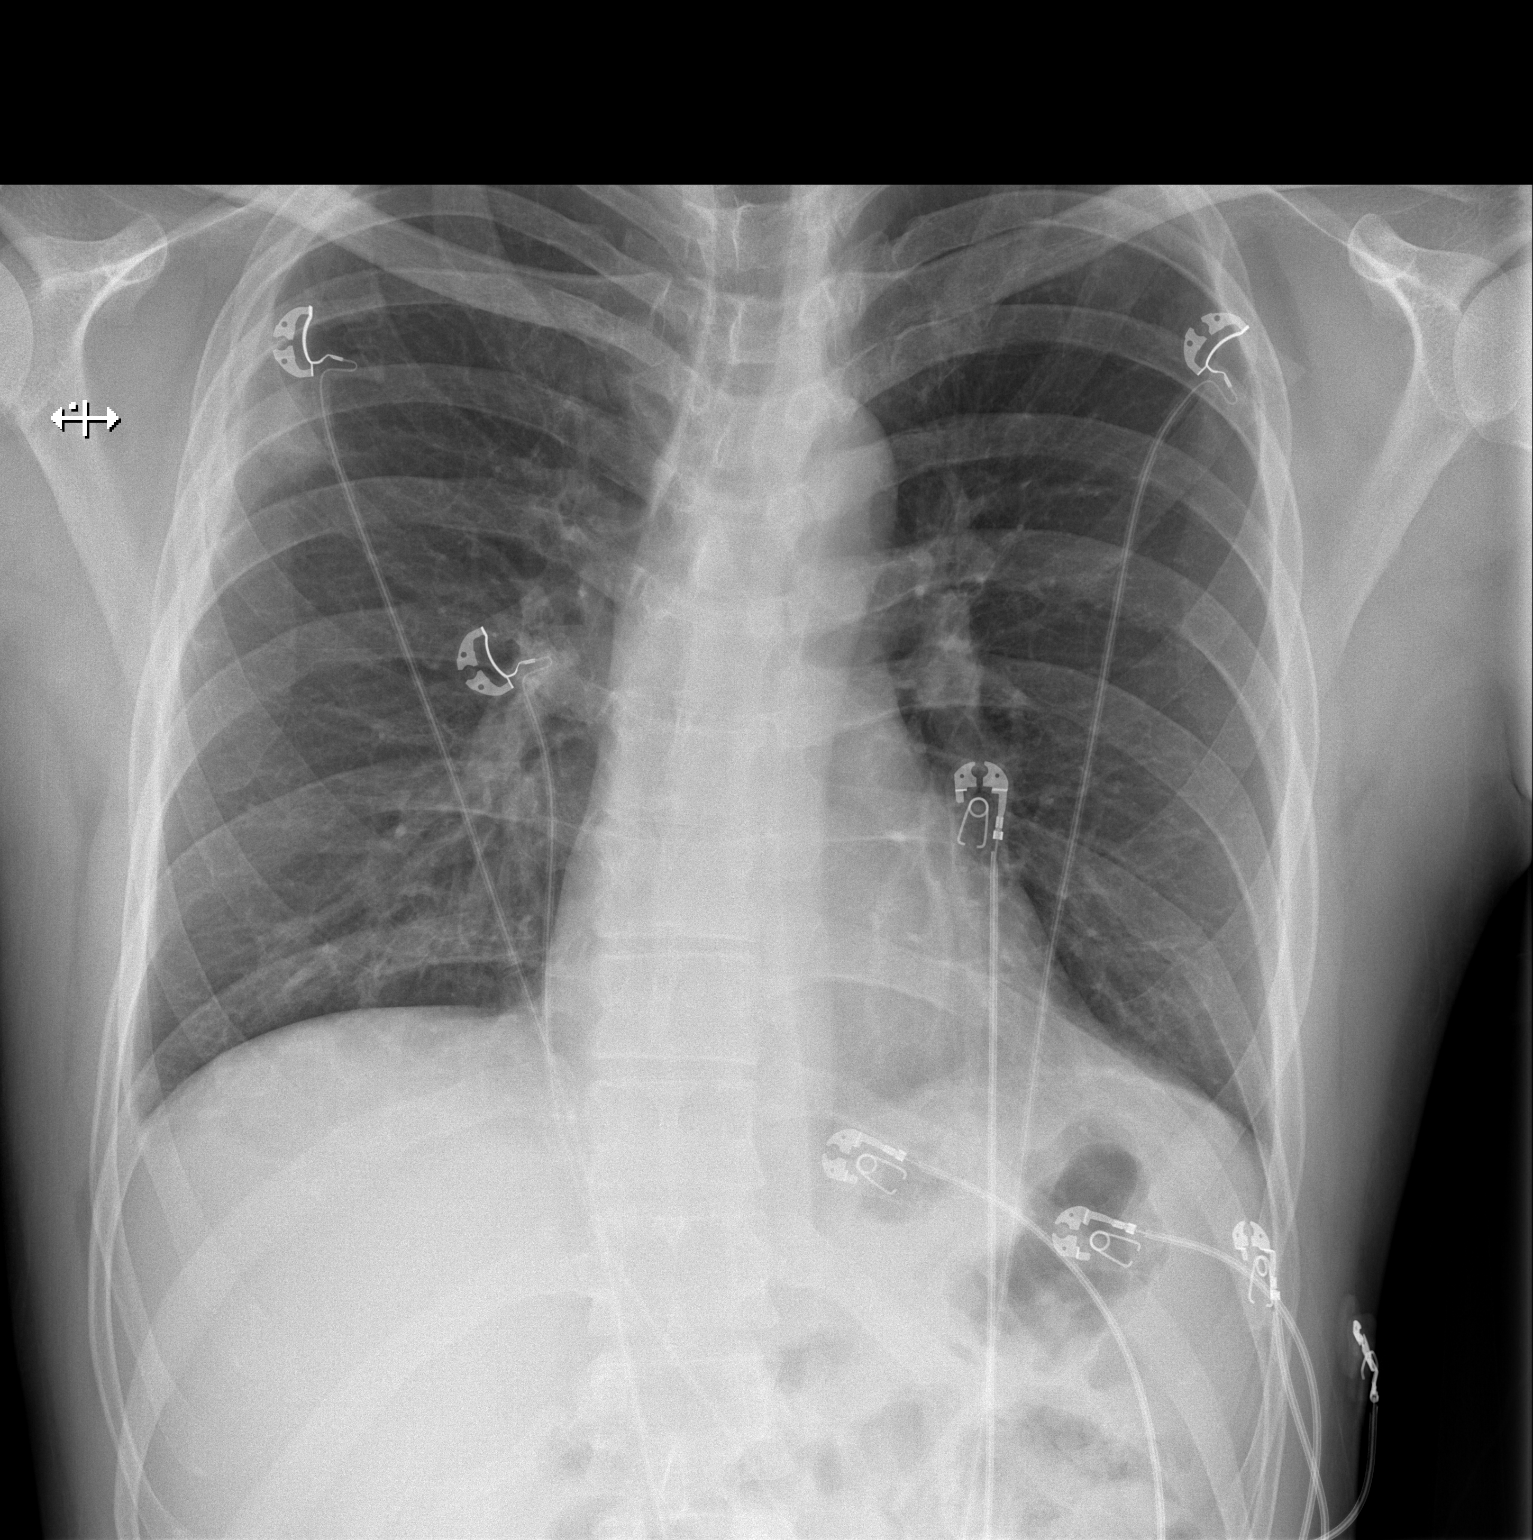

[w chest lat]
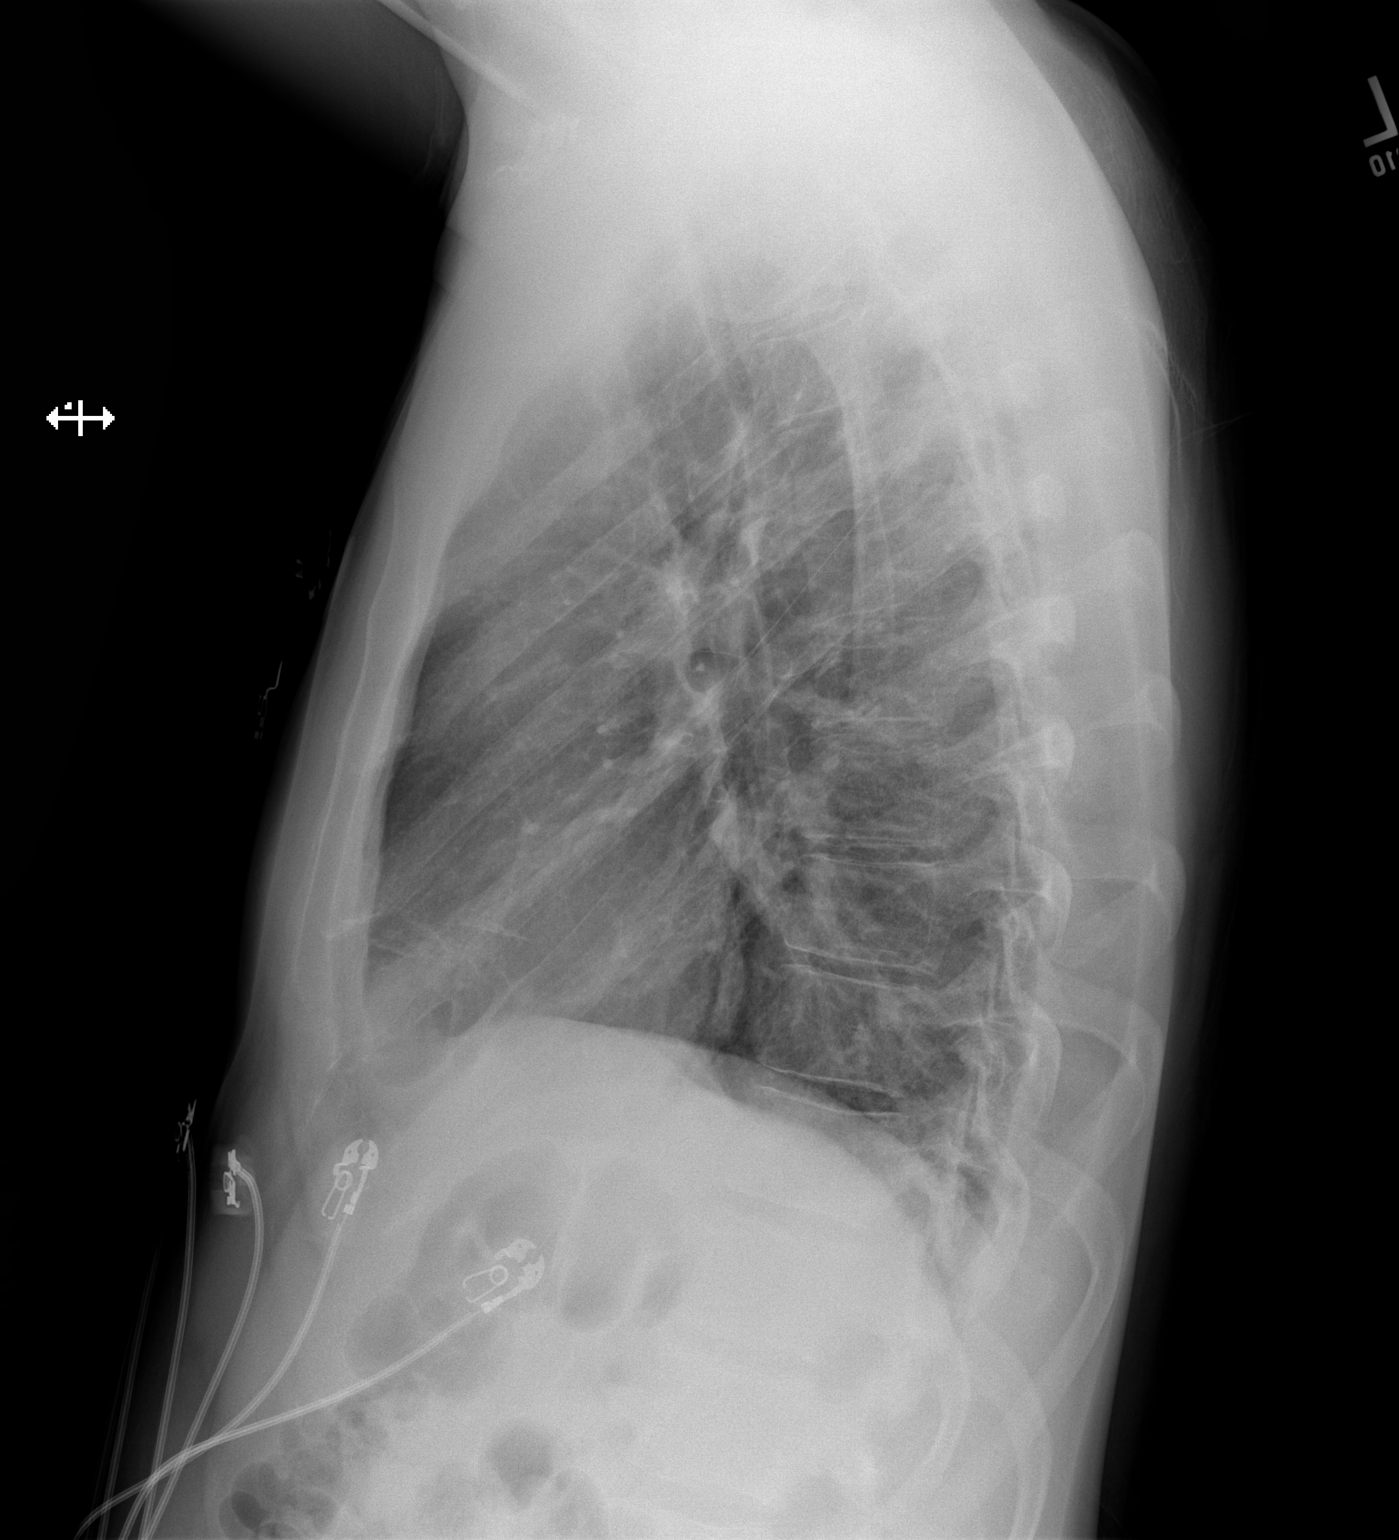

[2 of 2 positions shown; findings below may reference images not displayed]

FINDINGS: The heart size and mediastinal contours are within normal limits. No
pneumothorax or pleural effusion is noted. Left lung is clear.
Ill-defined wedge-shaped airspace opacity is noted peripherally in
the right upper lobe; given the history of chest pain, potentially
this may represent Hampton's hump sign suggesting possible pulmonary
embolus. Alternatively this may simply represent focal pneumonia.
The visualized skeletal structures are unremarkable.
IMPRESSION: Ill-defined wedge-shaped low density is noted laterally in the right
upper lobe which potentially may represent Hampton's hump sign and
possible pulmonary embolus. Alternatively, it may simply represent
peripheral pneumonia or inflammation. Clinical correlation is
recommended and CTA of the chest may be performed for further
evaluation.

## 2020-02-27 IMAGING — CR DG CHEST 2V
2 series · 2 of 2 positions shown · non-contrast
Comparison: 04/10/2018

CLINICAL DATA: Chest pain

EXAM:
CHEST - 2 VIEW

[w chest pa]
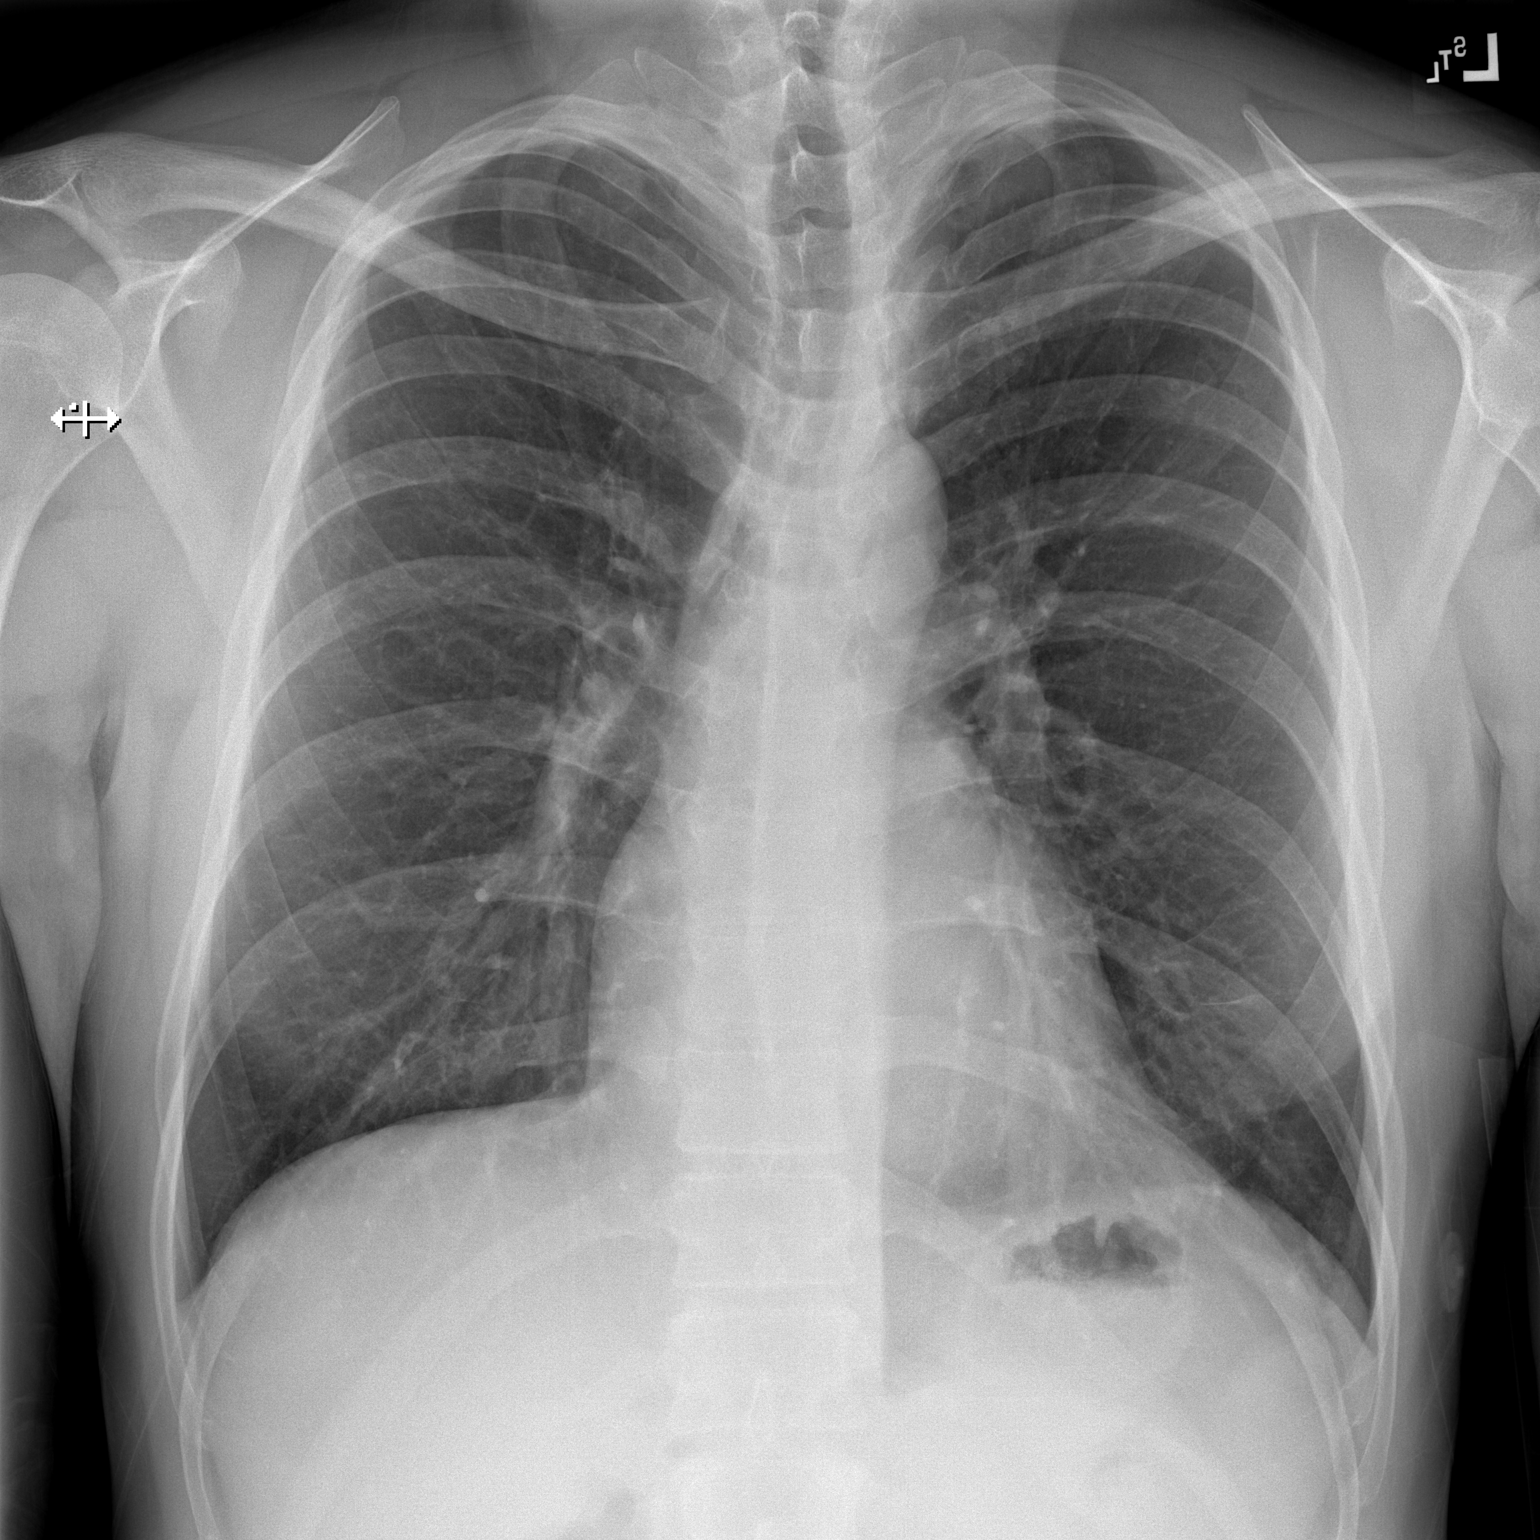

[w chest lat]
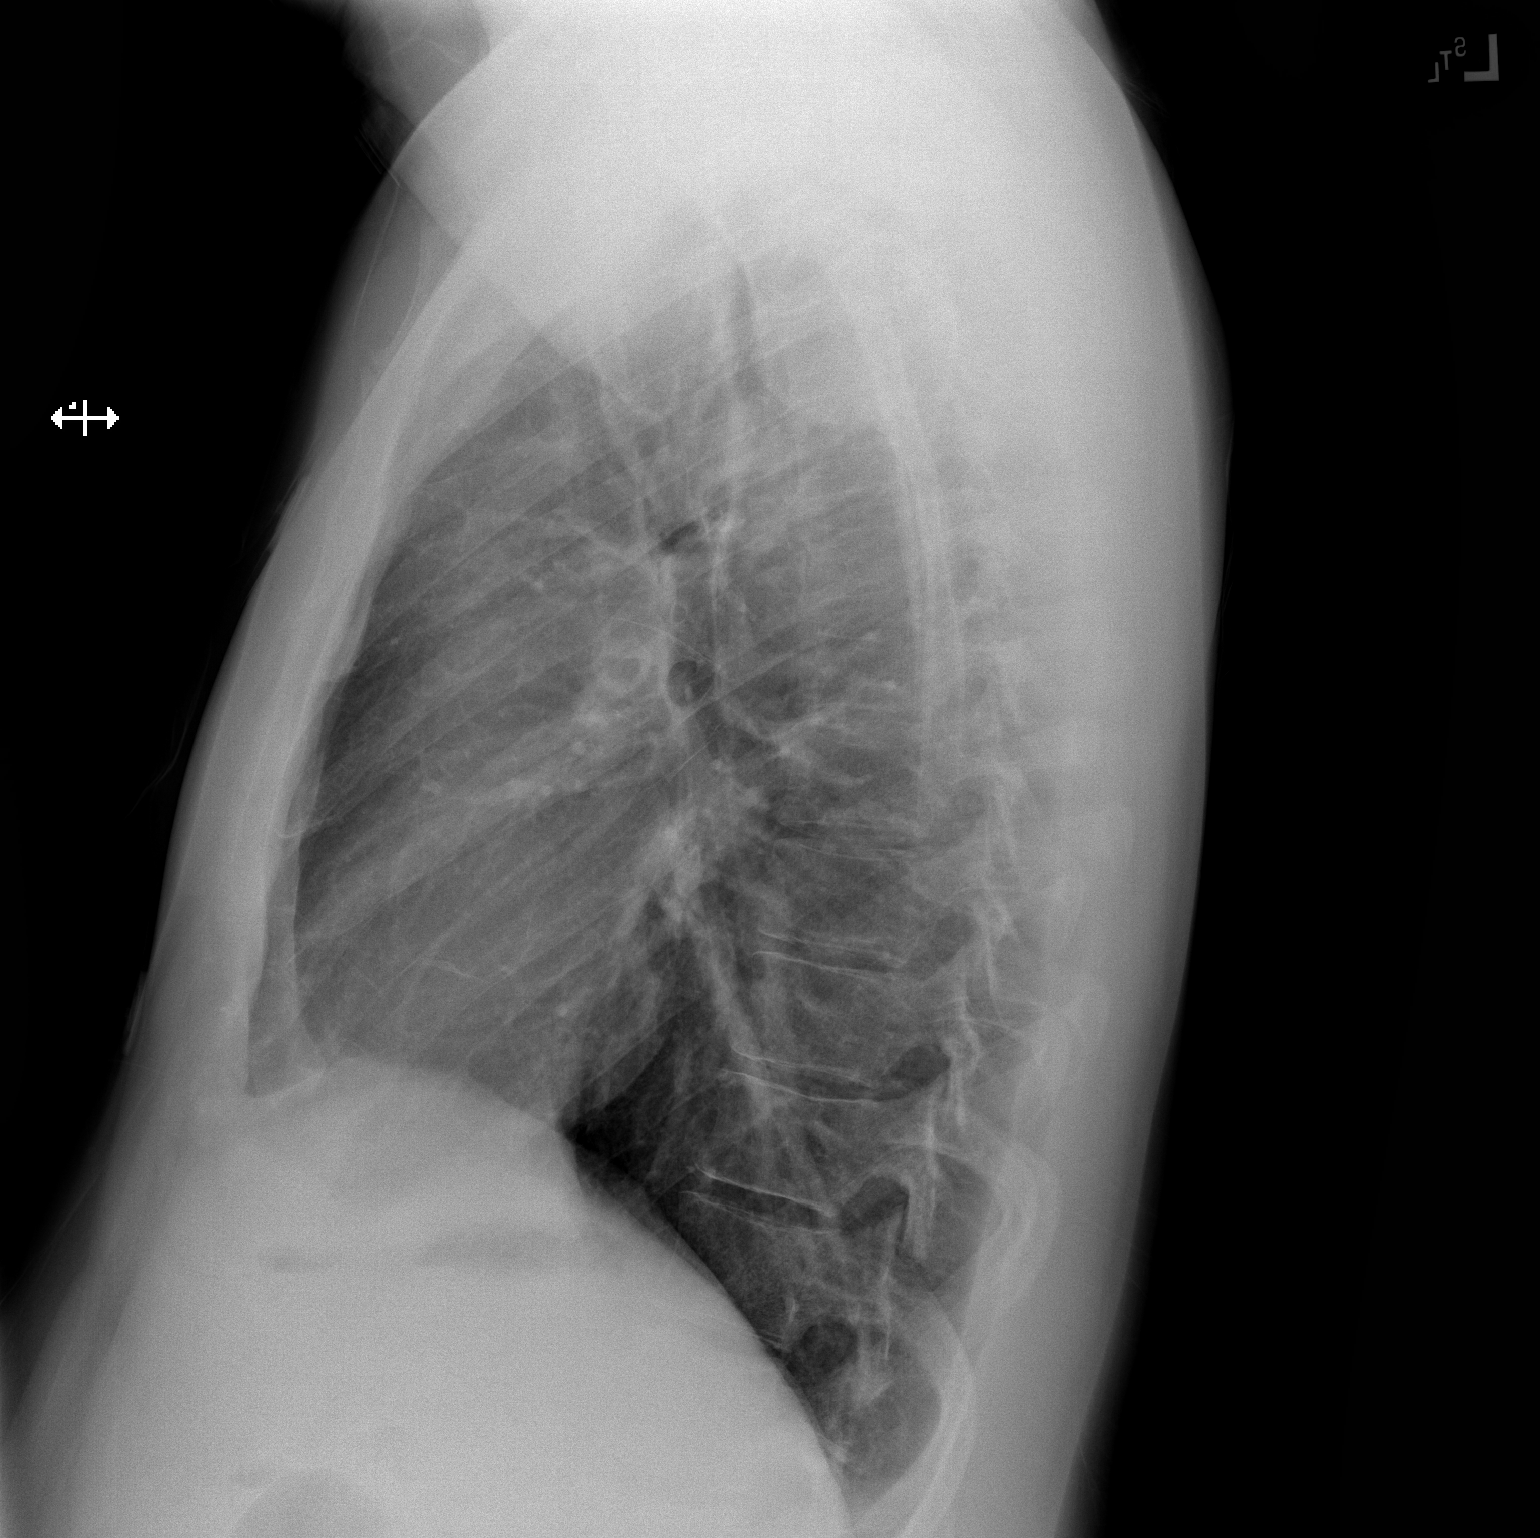

[2 of 2 positions shown; findings below may reference images not displayed]

FINDINGS: The heart size and mediastinal contours are within normal limits.
Both lungs are clear. The visualized skeletal structures are
unremarkable.
IMPRESSION: No acute abnormality of the lungs.

## 2023-09-30 ENCOUNTER — Emergency Department (HOSPITAL_COMMUNITY): Payer: Self-pay

## 2023-09-30 ENCOUNTER — Other Ambulatory Visit: Payer: Self-pay

## 2023-09-30 ENCOUNTER — Encounter (HOSPITAL_COMMUNITY): Payer: Self-pay | Admitting: Pharmacy Technician

## 2023-09-30 ENCOUNTER — Emergency Department (HOSPITAL_COMMUNITY)
Admission: EM | Admit: 2023-09-30 | Discharge: 2023-09-30 | Disposition: A | Discharge status: Home / Self Care | Payer: Self-pay

## 2023-09-30 DIAGNOSIS — J029 Acute pharyngitis, unspecified: Secondary | ICD-10-CM | POA: Insufficient documentation

## 2023-09-30 DIAGNOSIS — M545 Low back pain, unspecified: Secondary | ICD-10-CM | POA: Insufficient documentation

## 2023-09-30 LAB — BASIC METABOLIC PANEL WITH GFR
Anion gap: 11 (ref 5–15)
BUN: 12 mg/dL (ref 6–20)
CO2: 26 mmol/L (ref 22–32)
Calcium: 9.4 mg/dL (ref 8.9–10.3)
Chloride: 101 mmol/L (ref 98–111)
Creatinine, Ser: 0.91 mg/dL (ref 0.61–1.24)
GFR, Estimated: >60 mL/min (ref >60)
Glucose, Bld: 98 mg/dL (ref 70–99)
Potassium: 3.9 mmol/L (ref 3.5–5.1)
Sodium: 138 mmol/L (ref 135–145)

## 2023-09-30 LAB — CBC
HCT: 41.5 % (ref 39.0–52.0)
Hemoglobin: 14.7 g/dL (ref 13.0–17.0)
MCH: 30.9 pg (ref 26.0–34.0)
MCHC: 35.4 g/dL (ref 30.0–36.0)
MCV: 87.4 fL (ref 80.0–100.0)
Platelets: 293 K/uL (ref 150–400)
RBC: 4.75 MIL/uL (ref 4.22–5.81)
RDW: 13.6 % (ref 11.5–15.5)
WBC: 10.3 K/uL (ref 4.0–10.5)
nRBC: 0 % (ref 0.0–0.2)

## 2023-09-30 LAB — RESP PANEL BY RT-PCR (RSV, FLU A&B, COVID)  RVPGX2
Influenza A by PCR: NEGATIVE
Influenza B by PCR: NEGATIVE
Resp Syncytial Virus by PCR: NEGATIVE
SARS Coronavirus 2 by RT PCR: NEGATIVE

## 2023-09-30 LAB — GROUP A STREP BY PCR: Group A Strep by PCR: NOT DETECTED

## 2023-09-30 MED ORDER — KETOROLAC TROMETHAMINE 15 MG/ML IJ SOLN
15.0000 mg | Freq: Once | INTRAMUSCULAR | Status: AC
  Administered 2023-09-30: 15 mg via INTRAMUSCULAR
  Filled 2023-09-30: qty 1

## 2023-09-30 MED ORDER — MELOXICAM 7.5 MG PO TABS: 7.5000 mg | ORAL_TABLET | Freq: Every day | ORAL | 0 refills | Status: AC

## 2023-09-30 MED ORDER — ACETAMINOPHEN 500 MG PO TABS
1000.0000 mg | ORAL_TABLET | Freq: Once | ORAL | Status: AC
  Administered 2023-09-30: 1000 mg via ORAL
  Filled 2023-09-30: qty 2

## 2023-09-30 MED ORDER — LIDOCAINE 5 % EX PTCH
1.0000 | MEDICATED_PATCH | CUTANEOUS | Status: DC
  Administered 2023-09-30: 1 via TRANSDERMAL
  Filled 2023-09-30: qty 1

## 2023-09-30 MED ORDER — LIDOCAINE 5 % EX PTCH: 1.0000 | MEDICATED_PATCH | CUTANEOUS | 0 refills | Status: AC

## 2023-09-30 NOTE — ED Provider Notes (Signed)
 [facility] AT [facility] Provider Note   CSN: [id number] Arrival date & time: 09/30/23  0930     Patient presents with: Back Pain and Sore Throat  HPI [name] is a 38 y.o. male presenting for back pain that started this past Thursday.  Pain is in the midline of the lower back and does radiate to the right down his leg.  It feels sharp and intermittent.  Denies trauma.  Denies fever, IV drug use, saddle anesthesia.  It is worse with movement.  Has not tried any medications to make it better.  Denies any radiation of that pain into the abdomen.  Denies urinary symptoms or bowel changes.  Denies nausea or vomiting.    Back Pain Sore Throat       Prior to Admission medications   Medication Sig Start Date End Date Taking? Authorizing Provider  lidocaine  (LIDODERM ) 5 % Place 1 patch onto the skin daily. Remove & Discard patch within 12 hours or as directed by MD 09/30/23  Yes Lang Norleen POUR, PA-C  meloxicam  (MOBIC ) 7.5 MG tablet Take 1 tablet (7.5 mg total) by mouth daily for 7 days. 09/30/23 10/07/23 Yes Eduardo Honor K, PA-C  acetaminophen  (TYLENOL ) 500 MG tablet Take 2,000 mg by mouth every 6 (six) hours as needed for moderate pain.    [provider]  doxycycline  (VIBRAMYCIN ) 100 MG capsule Take 1 capsule (100 mg total) by mouth 2 (two) times daily. One po bid x 7 days 04/10/18   Kehrli, Kelsey F, PA-C  HYDROcodone -acetaminophen  (NORCO) 5-325 MG tablet Take 1 tablet by mouth every 6 (six) hours as needed. 04/10/18   Kehrli, Kelsey F, PA-C    Allergies: Penicillins    Review of Systems  Musculoskeletal:  Positive for back pain.    Updated Vital Signs BP (!) 155/98 (BP Location: Left Arm)   Pulse (!) 108   Temp 99.1 F (37.3 C) (Oral)   Resp 16   SpO2 96%   Physical Exam Vitals and nursing note reviewed.  HENT:     Head: Normocephalic and atraumatic.     Mouth/Throat:     Mouth: Mucous membranes are moist.     Pharynx:  Oropharynx is clear. Uvula midline. No pharyngeal swelling, oropharyngeal exudate or posterior oropharyngeal erythema.  Eyes:     General:        Right eye: No discharge.        Left eye: No discharge.     Conjunctiva/sclera: Conjunctivae normal.  Cardiovascular:     Rate and Rhythm: Normal rate and regular rhythm.     Pulses: Normal pulses.     Heart sounds: Normal heart sounds.  Pulmonary:     Effort: Pulmonary effort is normal.     Breath sounds: Normal breath sounds.  Abdominal:     General: Abdomen is flat.     Palpations: Abdomen is soft.  Musculoskeletal:     Cervical back: Full passive range of motion without pain, normal range of motion and neck supple.     Lumbar back: No swelling, edema, deformity, signs of trauma, lacerations, spasms, tenderness or bony tenderness. Normal range of motion.     Comments: Steady gait.  Some tenderness elicited with flexion extension of the lower back.  Lymphadenopathy:     Cervical: No cervical adenopathy.  Skin:    General: Skin is warm and dry.  Neurological:     General: No focal deficit present.  Psychiatric:  Mood and Affect: Mood normal.     (all labs ordered are listed, but only abnormal results are displayed) Labs Reviewed  GROUP A STREP BY PCR  RESP PANEL BY RT-PCR (RSV, FLU A&B, COVID)  [id number]  CBC  BASIC METABOLIC PANEL WITH GFR    EKG: None  Radiology: CT Lumbar Spine Wo Contrast Result Date: 09/30/2023 CLINICAL DATA:  Low back pain radiating down right leg for several days, now with bilateral lower extremity radiculopathy EXAM: CT LUMBAR SPINE WITHOUT CONTRAST TECHNIQUE: Multidetector CT imaging of the lumbar spine was performed without intravenous contrast administration. Multiplanar CT image reconstructions were also generated. RADIATION DOSE REDUCTION: This exam was performed according to the departmental dose-optimization program which includes automated exposure control, adjustment of the mA and/or kV  according to patient size and/or use of iterative reconstruction technique. COMPARISON:  03/13/2012 FINDINGS: Segmentation: 5 lumbar type vertebrae. Alignment: Normal. Vertebrae: No acute fracture or focal pathologic process. Paraspinal and other soft tissues: Negative. Disc levels: There is mild circumferential disc bulge at L3-4 and L4-5, without significant compressive sequela. At the L5-S1 level there is circumferential disc bulge with bilateral facet and ligamentum flavum hypertrophy. There is bilateral neural foraminal encroachment, right greater than left. There is also bilateral symmetrical lateral recess narrowing, with contact of the bilateral transiting S1 nerve roots. Reconstructed images demonstrate no additional findings. IMPRESSION: 1. Lower lumbar spondylosis and facet hypertrophy, greatest at the L5-S1 level. If clinical symptoms persist, nonemergent CT lumbar myelogram or MRI lumbar spine could be considered for further evaluation. 2. No acute lumbar spine fracture. Electronically Signed   By: Ozell Daring M.D.   On: 09/30/2023 14:06   DG Chest 2 View Result Date: 09/30/2023 CLINICAL DATA:  Short of breath since this morning, pain radiating from lower back to right leg EXAM: DG CHEST 2V COMPARISON:  12/15/2018 FINDINGS: Frontal and lateral views of the chest demonstrate an unremarkable cardiac silhouette. No acute airspace disease, effusion, or pneumothorax. There are no acute bony abnormalities. IMPRESSION: 1. No acute intrathoracic process. Electronically Signed   By: Ozell Daring M.D.   On: 09/30/2023 10:04     Procedures   Medications Ordered in the ED  lidocaine  (LIDODERM ) 5 % 1 patch (1 patch Transdermal Patch Applied 09/30/23 1503)  ketorolac  (TORADOL ) 15 MG/ML injection 15 mg (15 mg Intramuscular Given 09/30/23 1503)  acetaminophen  (TYLENOL ) tablet 1,000 mg (1,000 mg Oral Given 09/30/23 1503)                                    Medical Decision Making Amount and/or  Complexity of Data Reviewed Labs: ordered. Radiology: ordered.  Risk OTC drugs. Prescription drug management.   Initial Impression and Ddx 38 year old well-appearing male presenting for lower back pain and sore throat.  Exam notable for tenderness with active flexion extension of lower back.  DDx includes cauda equina syndrome, kidney stone, pyelonephritis, spinal infection, strep pharyngitis, retropharyngeal abscess, other. Patient PMH that increases complexity of ED encounter:  none  Interpretation of Diagnostics - I independent reviewed and interpreted the labs as followed: no acute findings  - I independently visualized the following imaging with scope of interpretation limited to determining acute life threatening conditions related to emergency care: CT lumbar spine, which revealed  Lower lumbar spondylosis and facet hypertrophy, greatest at the L5-S1 level. CXR negative.  Shared findings with patient  Patient Reassessment and Ultimate Disposition/Management Workup unremarkable. Does not  suggest active infection. Advised NSAIDs and conservative treatment for his lower back pain at this time and to follow-up with neurosurgery if symptoms persist.  Also advised to establish care with PCP for his ongoing back pain.  Sent a 7-day course of meloxicam  to his pharmacy and Lidoderm  patches.  Discussed return precautions.  Discharged good condition.  Patient management required discussion with the following services or consulting groups:  None  Complexity of Problems Addressed Acute complicated illness or Injury  Additional Data Reviewed and Analyzed Further history obtained from: Past medical history and medications listed in the EMR and Prior ED visit notes  Patient Encounter Risk Assessment Consideration of hospitalization      Final diagnoses:  Low back pain, unspecified back pain laterality, unspecified chronicity, unspecified whether sciatica present    ED Discharge Orders           Ordered    meloxicam  (MOBIC ) 7.5 MG tablet  Daily        09/30/23 1542    lidocaine  (LIDODERM ) 5 %  Every 24 hours        09/30/23 1542               Valisa Karpel K, PA-C 09/30/23 1600    Kammerer, Megan L, DO 10/02/23 2131

## 2023-09-30 NOTE — Discharge Instructions (Addendum)
 Evaluation for your lower back pain was overall reassuring. Would recommend that you follow-up with neurosurgery if your back pain persist.  Also please follow-up with PCP for your back pain.  I sent a 7-day course of meloxicam  and Lidoderm  patches to your pharmacy.  Labs are normal.

## 2023-09-30 NOTE — ED Triage Notes (Signed)
 Pt here POV with reports of pain to lower back and radiating down R leg for several days. Now radiating down both legs. Pt also endorses sore throat onset today. Denies sick contacts. Also with some intermittent shob.
